# Patient Record
Sex: Male | Born: 1985 | Race: White | Hispanic: No | Marital: Single | Smoking: Former smoker
Health system: Southern US, Community
[De-identification: ages and names within clinical notes are randomized; demographics above are authoritative.]

## PROBLEM LIST (undated history)

## (undated) DIAGNOSIS — F419 Anxiety disorder, unspecified: Secondary | ICD-10-CM

## (undated) DIAGNOSIS — F32A Depression, unspecified: Secondary | ICD-10-CM

## (undated) HISTORY — DX: Depression, unspecified: F32.A

## (undated) HISTORY — DX: Anxiety disorder, unspecified: F41.9

---

## 2018-08-15 ENCOUNTER — Encounter: Payer: Self-pay | Admitting: Emergency Medicine

## 2018-08-15 ENCOUNTER — Emergency Department
Admission: EM | Admit: 2018-08-15 | Discharge: 2018-08-15 | Disposition: A | Discharge status: Home / Self Care | Payer: Self-pay | Source: Home / Self Care | Attending: Family Medicine | Admitting: Family Medicine

## 2018-08-15 ENCOUNTER — Emergency Department (INDEPENDENT_AMBULATORY_CARE_PROVIDER_SITE_OTHER): Payer: Self-pay

## 2018-08-15 ENCOUNTER — Other Ambulatory Visit: Payer: Self-pay

## 2018-08-15 DIAGNOSIS — M940 Chondrocostal junction syndrome [Tietze]: Secondary | ICD-10-CM

## 2018-08-15 DIAGNOSIS — R079 Chest pain, unspecified: Secondary | ICD-10-CM

## 2018-08-15 DIAGNOSIS — R0602 Shortness of breath: Secondary | ICD-10-CM

## 2018-08-15 NOTE — ED Triage Notes (Signed)
Patient has noticed pressure in upper left chest combined with shortness of breath over past week; did have URI last week that he treated with OTCs and it resolved. Admits to vaping 100 hits since this morning. No signs of acute distress. Dr.Beese notified immediately after triage.

## 2018-08-15 NOTE — ED Provider Notes (Signed)
[facility]    CSN: [id number] Arrival date & time: 08/15/18  1529     History   Chief Complaint Chief Complaint  Patient presents with  . Chest Pain  . Shortness of Breath    HPI Jerry Davis is a 33 y.o. male.   Patient developed a sensation of constant tightness in his anterior chest about 5 days ago.  He feels like he cannot fully expand his chest, but denies actual dyspnea.  Last night he developed an intermittent lancinating pain in his right chest radiating to his back.  The pain is not worse with activity.  He reports that he had a cold-like illness about 1.5 weeks ago that resolved completely.  The history is provided by the patient.  Chest Pain  Pain location:  R chest Pain quality: stabbing and tightness   Pain radiates to:  Upper back Pain severity:  Mild Onset quality:  Sudden Duration:  5 hours Timing:  Constant Progression:  Unchanged Chronicity:  New Context: breathing, lifting, movement and at rest   Context: not eating, not raising an arm and not trauma   Relieved by:  None tried Worsened by:  Deep breathing and movement Ineffective treatments: Tums. Associated symptoms: shortness of breath   Associated symptoms: no abdominal pain, no AICD problem, no cough, no diaphoresis, no dizziness, no dysphagia, no fatigue, no fever, no heartburn, no lower extremity edema, no nausea, no palpitations, no vomiting and no weakness   Risk factors: smoking   Risk factors: no prior DVT/PE   Shortness of Breath  Associated symptoms: chest pain   Associated symptoms: no abdominal pain, no cough, no diaphoresis, no fever, no vomiting and no wheezing     History reviewed. No pertinent past medical history.  There are no active problems to display for this patient.   History reviewed. No pertinent surgical history.     Home Medications    Prior to Admission medications   Not on File    Family History History reviewed. No pertinent family  history.  Social History Social History   Tobacco Use  . Smoking status: Current Every Day Smoker  . Smokeless tobacco: Current User  Substance Use Topics  . Alcohol use: Yes  . Drug use: Not on file     Allergies   Patient has no known allergies.   Review of Systems Review of Systems  Constitutional: Negative for appetite change, chills, diaphoresis, fatigue and fever.  HENT: Negative for trouble swallowing.   Respiratory: Positive for chest tightness and shortness of breath. Negative for cough, wheezing and stridor.   Cardiovascular: Positive for chest pain. Negative for palpitations and leg swelling.  Gastrointestinal: Negative for abdominal pain, heartburn, nausea and vomiting.  Neurological: Negative for dizziness and weakness.  All other systems reviewed and are negative.    Physical Exam Triage Vital Signs ED Triage Vitals  Enc Vitals Group     BP 08/15/18 1605 (!) 148/86     Pulse Rate 08/15/18 1605 73     Resp 08/15/18 1605 18     Temp 08/15/18 1605 97.9 F (36.6 C)     Temp Source 08/15/18 1605 Oral     SpO2 08/15/18 1605 97 %     Weight 08/15/18 1607 (!) 350 lb (158.8 kg)     Height 08/15/18 1607  (1.803 m)     Head Circumference --      Peak Flow --      Pain Score 08/15/18 1606  1     Pain Loc --      Pain Edu? --      Excl. in GC? --    No data found.  Updated Vital Signs BP (!) 148/86 (BP Location: Left Arm)   Pulse 73   Temp 97.9 F (36.6 C) (Oral)   Resp 18   Ht 5\' 11"  (1.803 m)   Wt (!) 158.8 kg   SpO2 97%   BMI 48.82 kg/m   Visual Acuity Right Eye Distance:   Left Eye Distance:   Bilateral Distance:    Right Eye Near:   Left Eye Near:    Bilateral Near:     Physical Exam Vitals signs and nursing note reviewed.  Constitutional:      General: He is not in acute distress.    Appearance: He is well-developed. He is obese.  HENT:     Head: Normocephalic.     Right Ear: Tympanic membrane and ear canal normal.     Left  Ear: Tympanic membrane and ear canal normal.     Nose: Nose normal.     Mouth/Throat:     Pharynx: Oropharynx is clear.  Eyes:     Conjunctiva/sclera: Conjunctivae normal.     Pupils: Pupils are equal, round, and reactive to light.  Cardiovascular:     Rate and Rhythm: Regular rhythm.     Heart sounds: Normal heart sounds.  Pulmonary:     Breath sounds: Normal breath sounds.  Chest:     Chest wall: Tenderness present. No crepitus.       Comments: Chest:  Distinct tenderness to palpation over the mid-sternum.  Abdominal:     General: Abdomen is flat. Bowel sounds are normal.     Palpations: Abdomen is soft.     Tenderness: There is no abdominal tenderness.  Musculoskeletal:        General: No tenderness.     Right lower leg: No edema.     Left lower leg: No edema.  Lymphadenopathy:     Cervical: No cervical adenopathy.  Skin:    General: Skin is warm and dry.     Findings: No rash.  Neurological:     Mental Status: He is alert.      UC Treatments / Results  Labs (all labs ordered are listed, but only abnormal results are displayed) Labs Reviewed - No data to display  EKG None  Radiology Dg Chest 2 View  Result Date: 08/15/2018 CLINICAL DATA:  Chest pain with shortness of breath for 6 days. Smoker. EXAM: CHEST - 2 VIEW COMPARISON:  None. FINDINGS: The heart size and mediastinal contours are normal. The lungs are clear. There is no pleural effusion or pneumothorax. No acute osseous findings are identified. IMPRESSION: No active cardiopulmonary process. Electronically Signed   By: Carey Bullocks M.D.   On: 08/15/2018 16:41    Procedures Procedures (including critical care time)  Medications Ordered in UC Medications - No data to display  Initial Impression / Assessment and Plan / UC Course  I have reviewed the triage vital signs and the nursing notes.  Pertinent labs & imaging results that were available during my care of the patient were reviewed by me and  considered in my medical decision making (see chart for details).    Reassurance. Followup with Dr. Rodney Langton or Dr. Clementeen [facility] ([facility]) if not improving about two weeks.   Final Clinical Impressions(s) / UC Diagnoses   Final diagnoses:  Costochondritis  Discharge Instructions     Try taking Ibuprofen 200mg , 4 tabs every 8 hours with food.  Apply ice pack for 20 to 30 minutes, 3 to 4 times daily  Continue until pain decreases    ED Prescriptions    None         Lattie Haw, MD 08/15/18 [phone]

## 2018-08-15 NOTE — Discharge Instructions (Addendum)
Try taking Ibuprofen 200mg , 4 tabs every 8 hours with food.  Apply ice pack for 20 to 30 minutes, 3 to 4 times daily  Continue until pain decreases

## 2020-01-28 IMAGING — DX DG CHEST 2V
2 series · 2 of 2 positions shown · non-contrast
Comparison: None.

CLINICAL DATA: Chest pain with shortness of breath for 6 days.
Smoker.

EXAM:
CHEST - 2 VIEW

[chest pa]
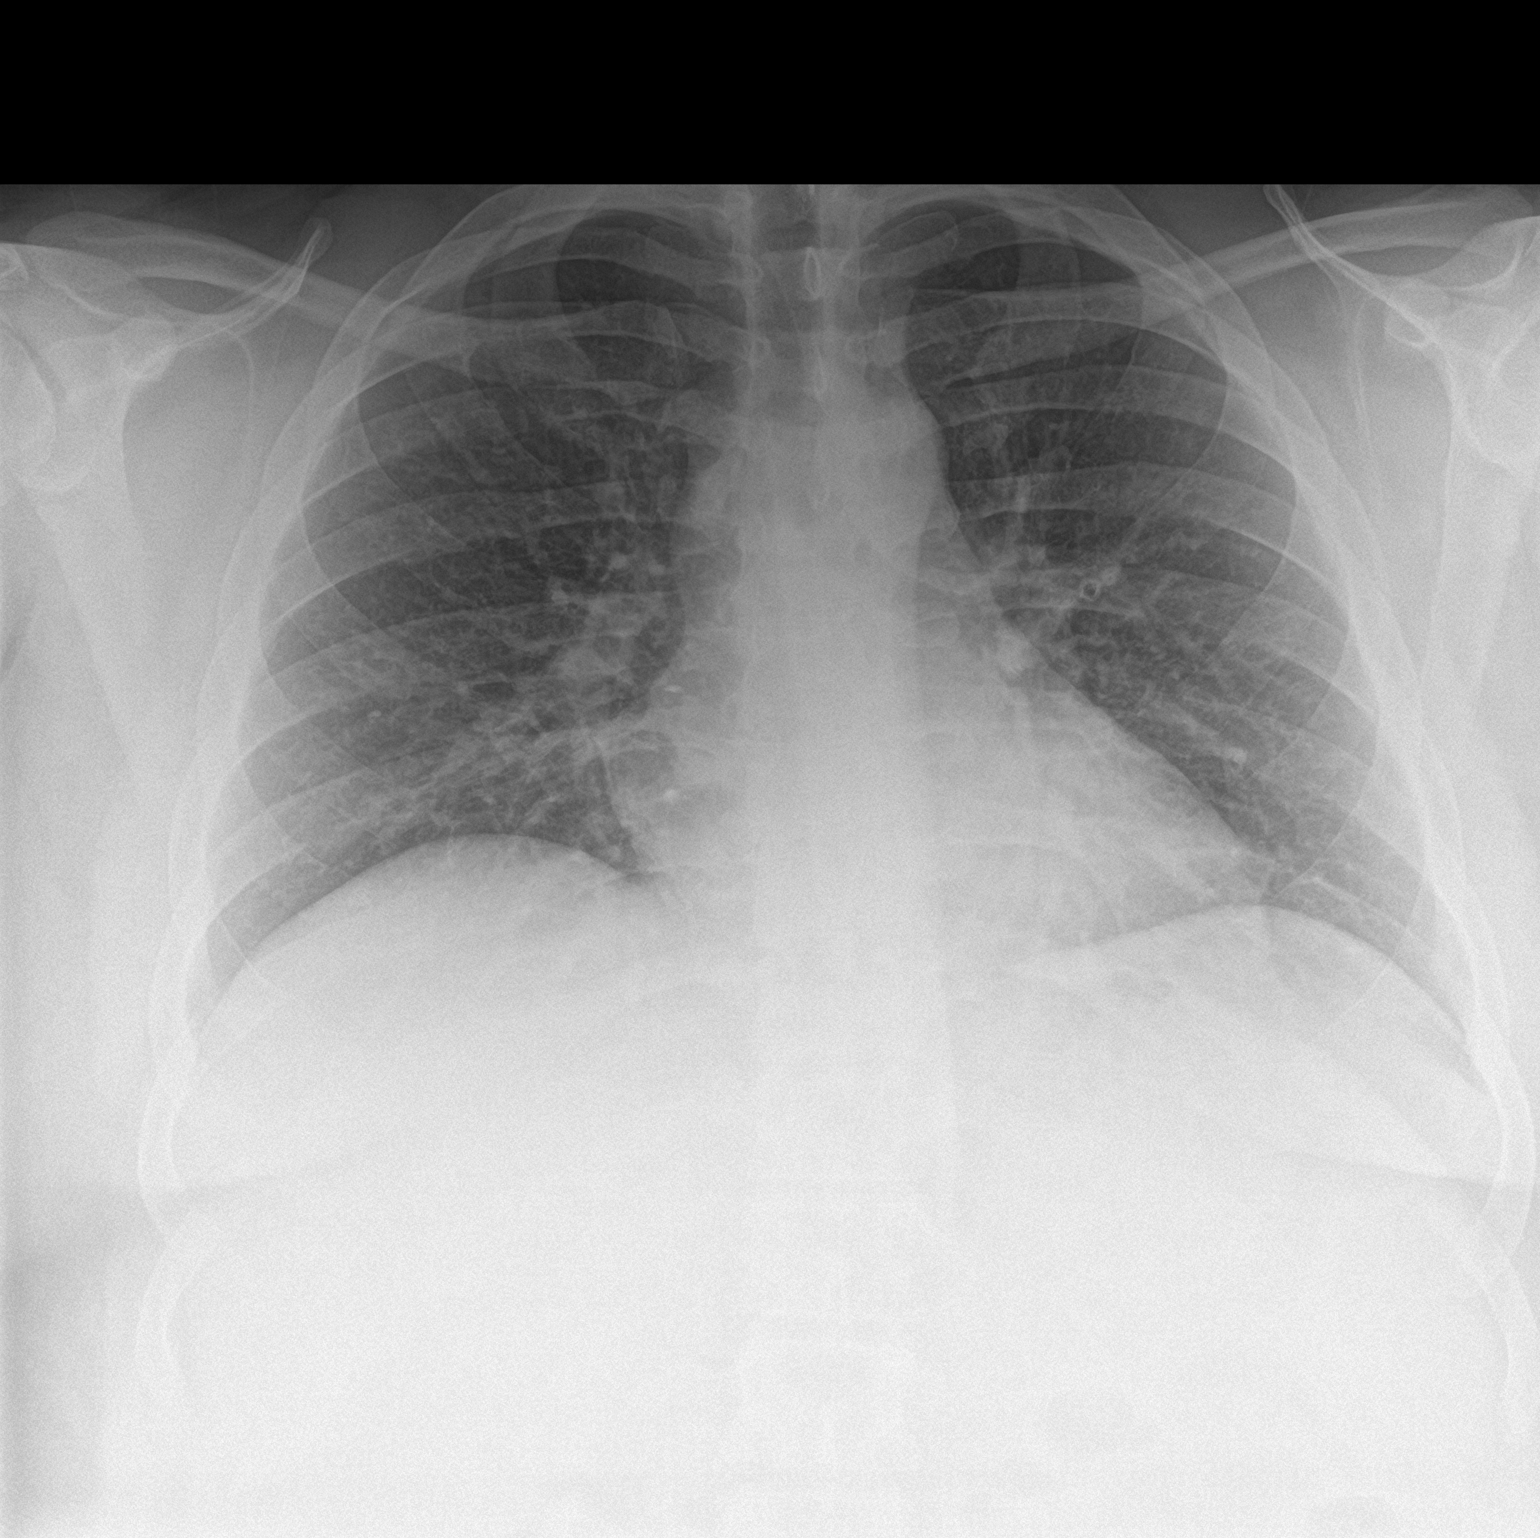

[chest lat]
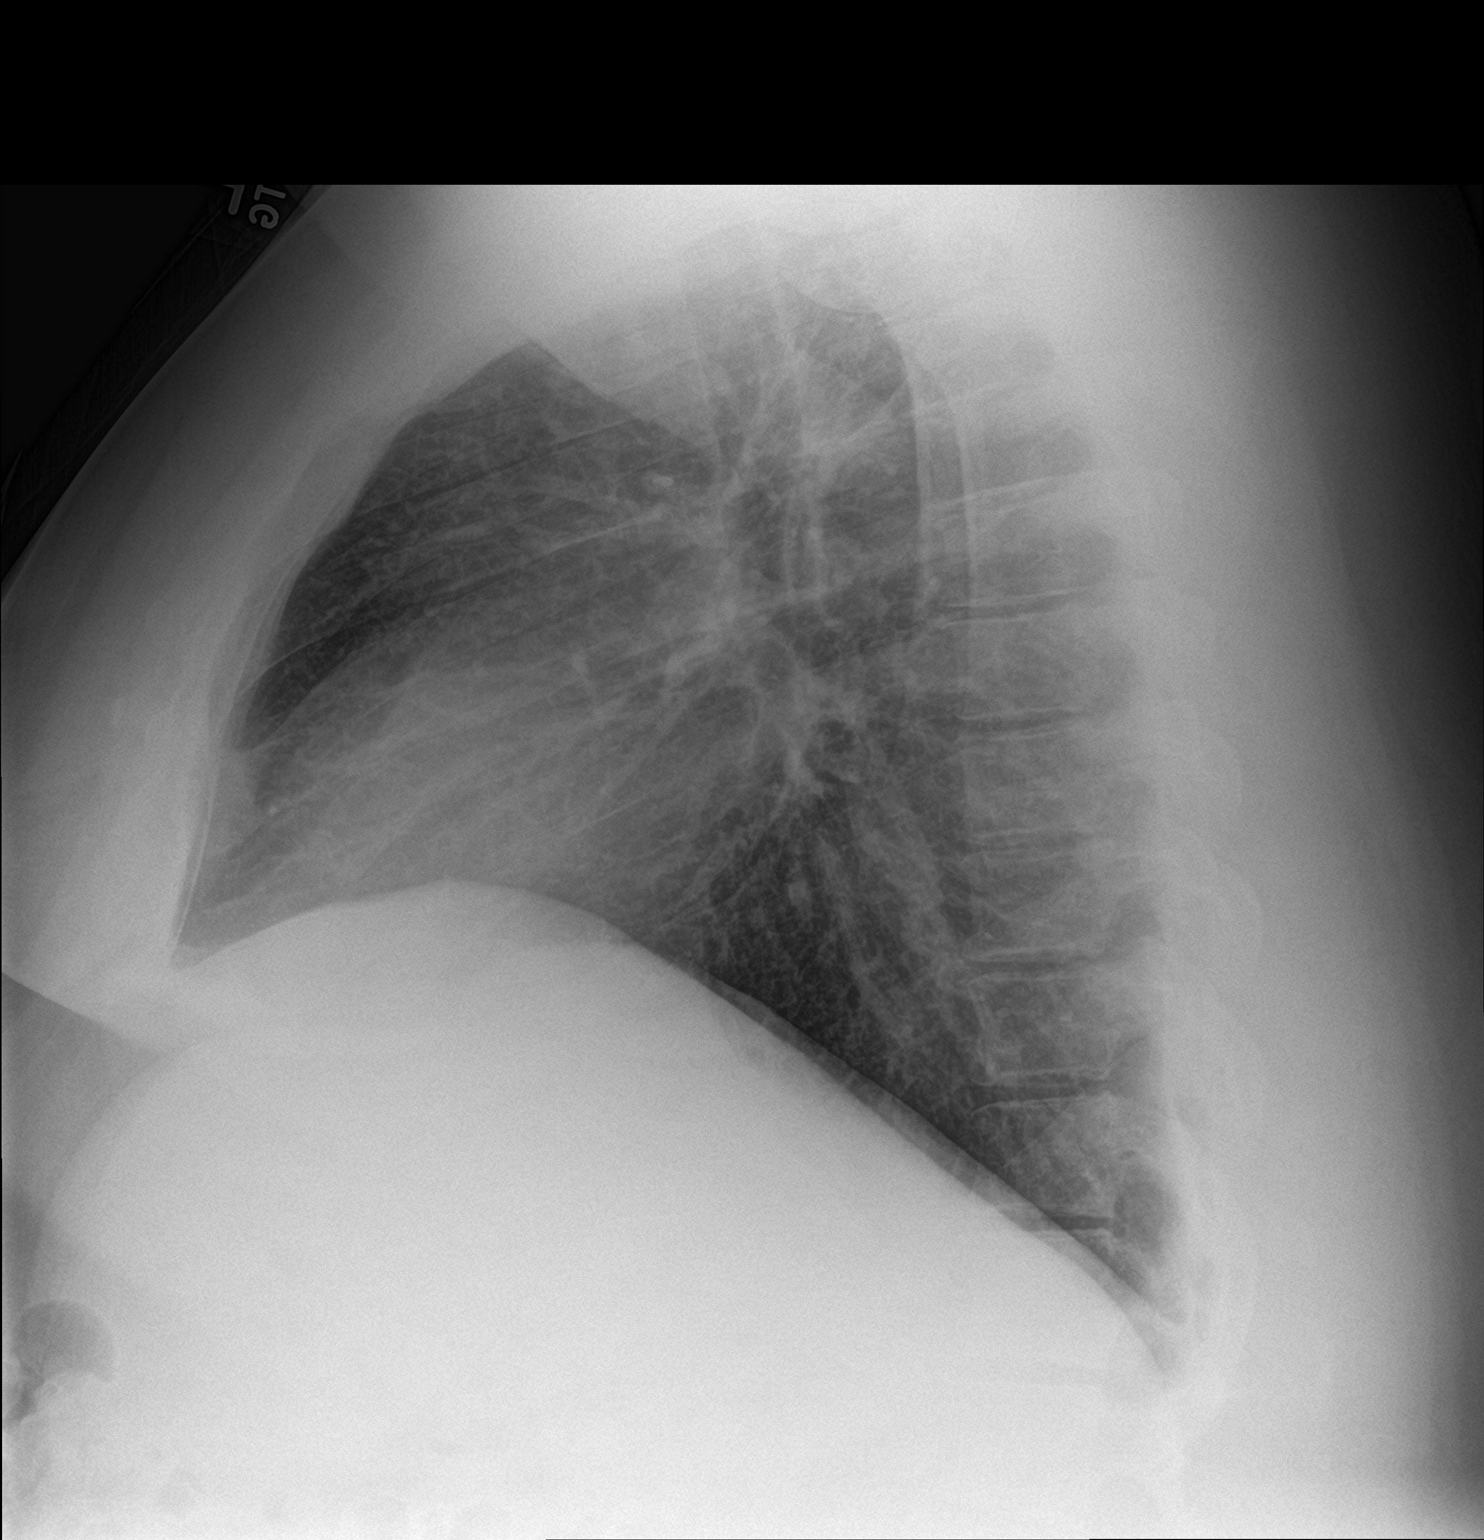

[2 of 2 positions shown; findings below may reference images not displayed]

FINDINGS: The heart size and mediastinal contours are normal. The lungs are
clear. There is no pleural effusion or pneumothorax. No acute
osseous findings are identified.
IMPRESSION: No active cardiopulmonary process.

## 2020-03-19 ENCOUNTER — Emergency Department: Admit: 2020-03-19 | Discharge status: Absent without leave | Payer: Self-pay

## 2020-03-19 ENCOUNTER — Other Ambulatory Visit: Payer: Self-pay

## 2020-03-19 ENCOUNTER — Encounter: Payer: Self-pay | Admitting: Family Medicine

## 2020-03-19 ENCOUNTER — Emergency Department
Admission: EM | Admit: 2020-03-19 | Discharge: 2020-03-19 | Disposition: A | Discharge status: Home / Self Care | Payer: Self-pay | Source: Home / Self Care

## 2020-03-19 DIAGNOSIS — J029 Acute pharyngitis, unspecified: Secondary | ICD-10-CM | POA: Diagnosis not present

## 2020-03-19 DIAGNOSIS — Z9189 Other specified personal risk factors, not elsewhere classified: Secondary | ICD-10-CM | POA: Diagnosis not present

## 2020-03-19 LAB — POCT RAPID STREP A (OFFICE): Rapid Strep A Screen: NEGATIVE

## 2020-03-19 MED ORDER — MAGIC MOUTHWASH W/LIDOCAINE: 10.0000 mL | ORAL | 0 refills | Status: DC | PRN

## 2020-03-19 NOTE — ED Triage Notes (Signed)
Pt presents to Urgent Care w/ c/o sore throat since yesterday morning. States it is worse today. Afebrile. Pt states he was tested for covid 4 days ago for work--test was negative.

## 2020-03-19 NOTE — Discharge Instructions (Addendum)
We are running the PCR Covid test which is more accurate than the rapid PCR test.  The results should be back within 4 days.   Strategies to prevent and/or treat COVID-19:  Vitamin D3 5000 IU (125 mcg) daily Vitamin C 500 mg twice daily Zinc 50 to 75 mg daily  Pepcid 20 mg twice a day  Listerine type mouthwash 4 times a day

## 2020-03-19 NOTE — ED Provider Notes (Signed)
[facility]    CSN: [id number] Arrival date & time: 03/19/20  0900      History   Chief Complaint Chief Complaint  Patient presents with  . Sore Throat    HPI BLAIKE Jerry Davis is a 34 y.o. male.   This is an established [place] urgent care patient, 34 year old male, presenting with sore throat.  Pt presents to Urgent Care w/ c/o sore throat since yesterday morning. States it is worse today. Afebrile. Pt states he was tested for covid 4 days ago for work--test was negative.   Patient works for [employer].  Company had a positive Covid employee recently and so the entire staff was tested, although the patient had no direct contact with that employee.  Patient denies cough, fever, change in appetite, change in sense of smell, or GI symptoms.     History reviewed. No pertinent past medical history.  There are no problems to display for this patient.   History reviewed. No pertinent surgical history.     Home Medications    Prior to Admission medications   Medication Sig Start Date End Date Taking? Authorizing Provider  magic mouthwash w/lidocaine SOLN Take 10 mLs by mouth every 2 (two) hours as needed for mouth pain. 03/19/20   Elvina Sidle, MD    Family History History reviewed. No pertinent family history.  Social History Social History   Tobacco Use  . Smoking status: Current Every Day Smoker  . Smokeless tobacco: Current User  Vaping Use  . Vaping Use: Every day  Substance Use Topics  . Alcohol use: Yes  . Drug use: Not on file     Allergies   Patient has no known allergies.   Review of Systems Review of Systems  HENT: Positive for sore throat.   All other systems reviewed and are negative.    Physical Exam Triage Vital Signs ED Triage Vitals  Enc Vitals Group     BP 03/19/20 0922 136/90     Pulse Rate 03/19/20 0922 74     Resp 03/19/20 0922 16     Temp 03/19/20 0922 97.9 F (36.6 C)     Temp Source  03/19/20 0922 Oral     SpO2 03/19/20 0922 96 %     Weight 03/19/20 0917 (!) 370 lb (167.8 kg)     Height --      Head Circumference --      Peak Flow --      Pain Score 03/19/20 0917 5     Pain Loc --      Pain Edu? --      Excl. in GC? --    No data found.  Updated Vital Signs BP 136/90 (BP Location: Right Arm)   Pulse 74   Temp 97.9 F (36.6 C) (Oral)   Resp 16   Wt (!) 167.8 kg   SpO2 96%   BMI 51.60 kg/m    Physical Exam Vitals and nursing note reviewed.  Constitutional:      General: He is not in acute distress.    Appearance: He is well-developed. He is obese.  HENT:     Head: Normocephalic.     Right Ear: Tympanic membrane normal.     Left Ear: Tympanic membrane normal.     Mouth/Throat:     Mouth: Mucous membranes are moist.     Pharynx: Posterior oropharyngeal erythema present. No pharyngeal swelling.     Tonsils: Tonsillar exudate present.  Eyes:  Conjunctiva/sclera: Conjunctivae normal.  Cardiovascular:     Rate and Rhythm: Normal rate and regular rhythm.     Heart sounds: Normal heart sounds.  Pulmonary:     Effort: Pulmonary effort is normal.     Breath sounds: Normal breath sounds.  Musculoskeletal:     Cervical back: Normal range of motion and neck supple.  Lymphadenopathy:     Cervical: No cervical adenopathy.  Skin:    General: Skin is warm and dry.  Neurological:     Mental Status: He is alert.      UC Treatments / Results  Labs (all labs ordered are listed, but only abnormal results are displayed) Labs Reviewed  SARS-COV-2 RNA,(COVID-19) QUALITATIVE NAAT  POCT RAPID STREP A (OFFICE)    EKG   Radiology No results found.  Procedures Procedures (including critical care time)  Medications Ordered in UC Medications - No data to display  Initial Impression / Assessment and Plan / UC Course  I have reviewed the triage vital signs and the nursing notes.  Pertinent labs & imaging results that were available during my care  of the patient were reviewed by me and considered in my medical decision making (see chart for details).    Final Clinical Impressions(s) / UC Diagnoses   Final diagnoses:  Acute pharyngitis, unspecified etiology  At increased risk of exposure to COVID-19 virus     Discharge Instructions     We are running the PCR Covid test which is more accurate than the rapid PCR test.  The results should be back within 4 days.   Strategies to prevent and/or treat COVID-19:  Vitamin D3 5000 IU (125 mcg) daily Vitamin C 500 mg twice daily Zinc 50 to 75 mg daily  Pepcid 20 mg twice a day  Listerine type mouthwash 4 times a day       ED Prescriptions    Medication Sig Dispense Auth. Provider   magic mouthwash w/lidocaine SOLN Take 10 mLs by mouth every 2 (two) hours as needed for mouth pain. 360 mL Elvina Sidle, MD     I have reviewed the PDMP during this encounter.   Elvina Sidle, MD 03/19/20 [phone]

## 2020-03-20 LAB — SARS-COV-2 RNA,(COVID-19) QUALITATIVE NAAT: SARS CoV2 RNA: NOT DETECTED

## 2020-06-18 ENCOUNTER — Ambulatory Visit: Payer: Self-pay

## 2020-06-22 ENCOUNTER — Other Ambulatory Visit: Payer: Self-pay

## 2020-06-22 ENCOUNTER — Encounter: Payer: Self-pay | Admitting: Family Medicine

## 2020-06-22 ENCOUNTER — Encounter: Payer: Self-pay | Admitting: Neurology

## 2020-06-22 ENCOUNTER — Ambulatory Visit (INDEPENDENT_AMBULATORY_CARE_PROVIDER_SITE_OTHER): Payer: BC Managed Care – PPO | Admitting: Family Medicine

## 2020-06-22 ENCOUNTER — Telehealth: Payer: Self-pay | Admitting: Neurology

## 2020-06-22 VITALS — BP 159/87 | HR 88 | Temp 97.7°F | Wt 393.0 lb

## 2020-06-22 DIAGNOSIS — F411 Generalized anxiety disorder: Secondary | ICD-10-CM

## 2020-06-22 DIAGNOSIS — K219 Gastro-esophageal reflux disease without esophagitis: Secondary | ICD-10-CM

## 2020-06-22 DIAGNOSIS — R5383 Other fatigue: Secondary | ICD-10-CM

## 2020-06-22 DIAGNOSIS — R030 Elevated blood-pressure reading, without diagnosis of hypertension: Secondary | ICD-10-CM

## 2020-06-22 DIAGNOSIS — R631 Polydipsia: Secondary | ICD-10-CM

## 2020-06-22 MED ORDER — PANTOPRAZOLE SODIUM 40 MG PO TBEC: 40.0000 mg | DELAYED_RELEASE_TABLET | Freq: Every day | ORAL | 3 refills | Status: DC

## 2020-06-22 MED ORDER — HYDROXYZINE PAMOATE 25 MG PO CAPS: 25.0000 mg | ORAL_CAPSULE | Freq: Three times a day (TID) | ORAL | 1 refills | Status: DC | PRN

## 2020-06-22 MED ORDER — ESCITALOPRAM OXALATE 10 MG PO TABS: ORAL_TABLET | ORAL | 3 refills | Status: DC

## 2020-06-22 NOTE — Patient Instructions (Addendum)
Great to meet you today! Have labs completed today. Start escitalopram (lexapro).  Use hydroxyzine as needed for anxiety.  Start protonix for swallowing issues/heartburn.  See me again in 4-6 weeks.

## 2020-06-22 NOTE — Telephone Encounter (Signed)
Prior Authorization for Pantoprazole submitted via covermymeds. Awaiting response.  Your information has been submitted to [employer]. [employer] will review the request and notify you of the determination decision directly, typically within 72 hours of receiving all information.  You will also receive your request decision electronically. To check for an update later, open this request again from your dashboard.  If [employer] [place] has not responded within the specified timeframe or if you have any questions about your PA submission, contact [employer] [place] directly at [phone].  

## 2020-06-23 LAB — COMPLETE METABOLIC PANEL WITH GFR
AG Ratio: 1.7 (calc) (ref 1.0–2.5)
ALT: 36 U/L (ref 9–46)
AST: 28 U/L (ref 10–40)
Albumin: 4.5 g/dL (ref 3.6–5.1)
Alkaline phosphatase (APISO): 68 U/L (ref 36–130)
BUN: 12 mg/dL (ref 7–25)
CO2: 23 mmol/L (ref 20–32)
Calcium: 9.8 mg/dL (ref 8.6–10.3)
Chloride: 105 mmol/L (ref 98–110)
Creat: 1.05 mg/dL (ref 0.60–1.35)
GFR, Est African American: 108 mL/min/{1.73_m2} (ref >=60)
GFR, Est Non African American: 93 mL/min/{1.73_m2} (ref >=60)
Globulin: 2.7 g/dL (calc) (ref 1.9–3.7)
Glucose, Bld: 105 mg/dL — ABNORMAL HIGH (ref 65–99)
Potassium: 4 mmol/L (ref 3.5–5.3)
Sodium: 140 mmol/L (ref 135–146)
Total Bilirubin: 0.5 mg/dL (ref 0.2–1.2)
Total Protein: 7.2 g/dL (ref 6.1–8.1)

## 2020-06-23 LAB — CBC
HCT: 44.6 % (ref 38.5–50.0)
Hemoglobin: 15.6 g/dL (ref 13.2–17.1)
MCH: 30 pg (ref 27.0–33.0)
MCHC: 35 g/dL (ref 32.0–36.0)
MCV: 85.8 fL (ref 80.0–100.0)
MPV: 12.4 fL (ref 7.5–12.5)
Platelets: 225 Thousand/uL (ref 140–400)
RBC: 5.2 Million/uL (ref 4.20–5.80)
RDW: 12.3 % (ref 11.0–15.0)
WBC: 6.5 Thousand/uL (ref 3.8–10.8)

## 2020-06-23 LAB — TSH: TSH: 1.85 m[IU]/L (ref 0.40–4.50)

## 2020-06-25 DIAGNOSIS — F411 Generalized anxiety disorder: Secondary | ICD-10-CM | POA: Insufficient documentation

## 2020-06-25 DIAGNOSIS — R631 Polydipsia: Secondary | ICD-10-CM | POA: Insufficient documentation

## 2020-06-25 DIAGNOSIS — R030 Elevated blood-pressure reading, without diagnosis of hypertension: Secondary | ICD-10-CM | POA: Insufficient documentation

## 2020-06-25 DIAGNOSIS — R5383 Other fatigue: Secondary | ICD-10-CM | POA: Insufficient documentation

## 2020-06-25 NOTE — Progress Notes (Signed)
Jerry Davis - 34 y.o. male MRN [id number]  Date of birth: April 07, 1986  Subjective Chief Complaint  Patient presents with  . Establish Care    HPI Jerry Davis is a 34 y.o. male here today for initial visit.  He has not had a PCP In several years.  He has a few concerns today.   He reports feeling of increased thirst and fatigue.  Concerned about possible diabetes.  Some distant relatives with diabetes but no first degree relatives.  Denies increased urination.    He reports some difficulty with swallowing.  He feels like food gets stuck in his upper throat.  Does not notice with liquids.  He has had frequent heartburn.  No nausea or chronic cough.    He has also had some anxiety.  He has some stress related to job.  He has had some feeling of panic at times.  He has never tried medication or seen a therapist for this.  He would like to try medication to help with anxiety.   He doesn't want to see a therapist at this time.   ROS:  A comprehensive ROS was completed and negative except as noted per HPI  No Known Allergies  History reviewed. No pertinent past medical history.  History reviewed. No pertinent surgical history.  Social History   Socioeconomic History  . Marital status: Single    Spouse name: Not on file  . Number of children: Not on file  . Years of education: Not on file  . Highest education level: Not on file  Occupational History  . Not on file  Tobacco Use  . Smoking status: Former Smoker    Types: Cigarettes    Quit date: 11/30/2019    Years since quitting: 0.5  . Smokeless tobacco: Never Used  Vaping Use  . Vaping Use: Every day  . Substances: Nicotine  Substance and Sexual Activity  . Alcohol use: Yes    Alcohol/week: 1.0 - 2.0 standard drink    Types: 1 - 2 Standard drinks or equivalent per week  . Drug use: Not Currently  . Sexual activity: Not Currently    Partners: Female  Other Topics Concern  . Not on file  Social History  Narrative  . Not on file   Social Determinants of Health   Financial Resource Strain: Not on file  Food Insecurity: Not on file  Transportation Needs: Not on file  Physical Activity: Not on file  Stress: Not on file  Social Connections: Not on file    Family History  Problem Relation Age of Onset  . Heart disease Maternal Grandfather     Health Maintenance  Topic Date Due  . Hepatitis C Screening  Never done  . HIV Screening  Never done  . COVID-19 Vaccine (1) 07/08/2020 (Originally 04/20/1993)  . INFLUENZA VACCINE  09/28/2020 (Originally 01/30/2020)  . TETANUS/TDAP  06/04/2028     ----------------------------------------------------------------------------------------------------------------------------------------------------------------------------------------------------------------- Physical Exam BP (!) 159/87 (BP Location: Left Arm, Patient Position: Sitting, Cuff Size: Large)   Pulse 88   Temp 97.7 F (36.5 C)   Wt (!) 393 lb (178.3 kg)   SpO2 97%   BMI 54.81 kg/m   Physical Exam Constitutional:      Appearance: Normal appearance.  HENT:     Head: Normocephalic and atraumatic.  Eyes:     General: No scleral icterus. Cardiovascular:     Rate and Rhythm: Normal rate and regular rhythm.  Pulmonary:     Effort: Pulmonary effort  is normal.     Breath sounds: Normal breath sounds.  Musculoskeletal:     Cervical back: Neck supple.  Neurological:     General: No focal deficit present.     Mental Status: He is alert.  Psychiatric:        Mood and Affect: Mood normal.        Behavior: Behavior normal.     ------------------------------------------------------------------------------------------------------------------------------------------------------------------------------------------------------------------- Assessment and Plan  GAD (generalized anxiety disorder) Start lexapro 10mg  daily (5mg  daily for the rist week).  Side effects of medication  discussed with him.  Hydoxyzine as needed for increased anxiety/panic.  Declines referral for therapist.   GERD (gastroesophageal reflux disease) Having reflux symptoms with dysphagia.  Will start protonix daily.  If not improving with this will place referral to GI.    Other fatigue Orders Placed This Encounter  Procedures  . COMPLETE METABOLIC PANEL WITH GFR  . CBC  . TSH     Blood pressure elevated without history of HTN Recheck at follow up.  If BP remains elevated will start medication.     Meds ordered this encounter  Medications  . escitalopram (LEXAPRO) 10 MG tablet    Sig: Take 5mg  daily x1 week then increase to 10mg  daily.    Dispense:  30 tablet    Refill:  3  . hydrOXYzine (VISTARIL) 25 MG capsule    Sig: Take 1 capsule (25 mg total) by mouth 3 (three) times daily as needed for anxiety.    Dispense:  45 capsule    Refill:  1  . pantoprazole (PROTONIX) 40 MG tablet    Sig: Take 1 tablet (40 mg total) by mouth daily.    Dispense:  30 tablet    Refill:  3    Return in about 4 weeks (around 07/20/2020) for anxiety/GERD.    This visit occurred during the SARS-CoV-2 public health emergency.  Safety protocols were in place, including screening questions prior to the visit, additional usage of staff PPE, and extensive cleaning of exam room while observing appropriate contact time as indicated for disinfecting solutions.

## 2020-06-25 NOTE — Assessment & Plan Note (Signed)
Start lexapro 10mg  daily (5mg  daily for the rist week).  Side effects of medication discussed with him.  Hydoxyzine as needed for increased anxiety/panic.  Declines referral for therapist.

## 2020-06-25 NOTE — Assessment & Plan Note (Signed)
Recheck at follow up.  If BP remains elevated will start medication.

## 2020-06-25 NOTE — Assessment & Plan Note (Signed)
Orders Placed This Encounter  Procedures  . COMPLETE METABOLIC PANEL WITH GFR  . CBC  . TSH

## 2020-06-25 NOTE — Assessment & Plan Note (Signed)
Having reflux symptoms with dysphagia.  Will start protonix daily.  If not improving with this will place referral to GI.

## 2021-01-05 ENCOUNTER — Encounter: Payer: Self-pay | Admitting: Emergency Medicine

## 2021-01-05 ENCOUNTER — Emergency Department
Admission: EM | Admit: 2021-01-05 | Discharge: 2021-01-05 | Disposition: A | Discharge status: Home / Self Care | Payer: Self-pay | Source: Home / Self Care | Attending: Family Medicine | Admitting: Family Medicine

## 2021-01-05 ENCOUNTER — Emergency Department (INDEPENDENT_AMBULATORY_CARE_PROVIDER_SITE_OTHER): Payer: Self-pay

## 2021-01-05 ENCOUNTER — Emergency Department: Admit: 2021-01-05 | Discharge status: Absent without leave | Payer: Self-pay

## 2021-01-05 ENCOUNTER — Other Ambulatory Visit: Payer: Self-pay

## 2021-01-05 DIAGNOSIS — M79671 Pain in right foot: Secondary | ICD-10-CM

## 2021-01-05 LAB — URIC ACID: Uric Acid, Serum: 8 mg/dL (ref 4.0–8.0)

## 2021-01-05 MED ORDER — PREDNISONE 20 MG PO TABS: ORAL_TABLET | ORAL | 0 refills | Status: DC

## 2021-01-05 NOTE — Discharge Instructions (Signed)
May take Tylenol daytime as needed for pain.  Increase fluid intake.

## 2021-01-05 NOTE — ED Triage Notes (Signed)
Patient c/o right foot pain since last night.  No apparent injury.  No swelling or redness.  Patient has taken Motrin for the pain.  No history of gout.

## 2021-01-05 NOTE — ED Provider Notes (Signed)
[facility]    CSN: [id number] Arrival date & time: 01/05/21  0850      History   Chief Complaint Chief Complaint  Patient presents with   Foot Pain    HPI Jerry Davis is a 35 y.o. male.   Last night after work while simply sitting, patient suddenly developed sharp pain in his right foot that has persisted.  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 He denies history of gout, but family history is positive for gout (father).  The history is provided by the patient.  Foot Pain This is a new problem. The current episode started yesterday. The problem occurs constantly. The problem has not changed since onset.Associated symptoms comments: none. The symptoms are aggravated by walking. Nothing relieves the symptoms. Treatments tried: Ibuprofen 800mg . The treatment provided mild relief.   History reviewed. No pertinent past medical history.  Patient Active Problem List   Diagnosis Date Noted   GAD (generalized anxiety disorder) 06/25/2020   Increased thirst 06/25/2020   Other fatigue 06/25/2020   Blood pressure elevated without history of HTN 06/25/2020   GERD (gastroesophageal reflux disease) 06/22/2020    History reviewed. No pertinent surgical history.     Home Medications    Prior to Admission medications   Medication Sig Start Date End Date Taking? Authorizing Provider  escitalopram (LEXAPRO) 10 MG tablet Take 5mg  daily x1 week then increase to 10mg  daily. 06/22/20  Yes , DO  hydrOXYzine (VISTARIL) 25 MG capsule Take 1 capsule (25 mg total) by mouth 3 (three) times daily as needed for anxiety. 06/22/20  Yes 06/24/20, DO  pantoprazole (PROTONIX) 40 MG tablet Take 1 tablet (40 mg total) by mouth daily. 06/22/20  Yes 06/24/20, DO  predniSONE (DELTASONE) 20 MG tablet Take one tab by mouth twice daily for 4 days, then one daily for 3 days. Take  with food. 01/05/21  Yes 06/24/20, MD  magic mouthwash w/lidocaine SOLN Take 10 mLs by mouth every 2 (two) hours as needed for mouth pain. 03/19/20   03/08/21, MD    Family History Family History  Problem Relation Age of Onset   Heart disease Maternal Grandfather     Social History Social History   Tobacco Use   Smoking status: Former    Pack years: 0.00    Types: Cigarettes    Quit date: 11/30/2019    Years since quitting: 1.1   Smokeless tobacco: Never  Vaping Use   Vaping Use: Every day   Substances: Nicotine  Substance Use Topics   Alcohol use: Yes    Alcohol/week: 1.0 - 2.0 standard drink    Types: 1 - 2 Standard drinks or equivalent per week   Drug use: Not Currently     Allergies   Patient has no known allergies.   Review of Systems Review of Systems  Constitutional:  Negative for activity change, appetite change, chills, diaphoresis, fatigue and fever.  Musculoskeletal:  Negative for joint swelling.       Right foot pain  Skin:  Negative for color change and rash.    Physical Exam Triage Vital Signs ED Triage Vitals  Enc Vitals Group     BP 01/05/21 0924 (!) 153/94     Pulse Rate 01/05/21 0924 74     Resp --      Temp 01/05/21 0924 97.8 F (36.6 C)  Temp Source 01/05/21 0924 Oral     SpO2 01/05/21 0924 97 %     Weight 01/05/21 0925 (!) 365 lb (165.6 kg)     Height 01/05/21 0925 5\' 11"  (1.803 m)     Head Circumference --      Peak Flow --      Pain Score 01/05/21 0925 7     Pain Loc --      Pain Edu? --      Excl. in GC? --    No data found.  Updated Vital Signs BP (!) 153/94 (BP Location: Left Arm)   Pulse 74   Temp 97.8 F (36.6 C) (Oral)   Ht 5\' 11"  (1.803 m)   Wt (!) 165.6 kg   SpO2 97%   BMI 50.91 kg/m   Visual Acuity Right Eye Distance:   Left Eye Distance:   Bilateral Distance:    Right Eye Near:   Left Eye Near:    Bilateral Near:     Physical Exam Vitals and nursing note reviewed.  Constitutional:       General: He is not in acute distress. HENT:     Head: Normocephalic.     Mouth/Throat:     Mouth: Mucous membranes are moist.  Eyes:     Pupils: Pupils are equal, round, and reactive to light.  Cardiovascular:     Rate and Rhythm: Normal rate.  Pulmonary:     Effort: Pulmonary effort is normal.  Musculoskeletal:        General: No swelling.     Cervical back: Neck supple.     Right lower leg: No edema.     Left lower leg: No edema.       Feet:     Comments: Right foot has distinct tenderness to palpation over the second MTP joint and second distal metatarsal.  No erythema or warmth.  Cap refill intact.  Lymphadenopathy:     Cervical: No cervical adenopathy.  Skin:    General: Skin is warm and dry.     Findings: No rash.  Neurological:     Mental Status: He is alert.     UC Treatments / Results  Labs (all labs ordered are listed, but only abnormal results are displayed) Labs Reviewed  URIC ACID    EKG   Radiology CLINICAL DATA:  Sudden onset foot pain yesterday. Tenderness over second MTP joint and distal second metatarsal.   EXAM: RIGHT FOOT COMPLETE - 3+ VIEW   COMPARISON:  None.   FINDINGS: No evidence of acute fracture. There is no significant degenerative change. There is mild soft tissue swelling of the foot. Small plantar calcaneal spur.   IMPRESSION: No acute osseous abnormality.  Soft tissue swelling of the foot.     Electronically Signed   By: 03/08/21   On: 01/05/2021 11:00  Procedures Procedures (including critical care time)  Medications Ordered in UC Medications - No data to display  Initial Impression / Assessment and Plan / UC Course  I have reviewed the triage vital signs and the nursing notes.  Pertinent labs & imaging results that were available during my care of the patient were reviewed by me and considered in my medical decision making (see chart for details).    Suspect acute gout.   Check uric acid.  Begin  prednisone burst/taper. Followup with Dr. Cap[facility] if not improving about one week.  Final Clinical Impressions(s) / UC Diagnoses   Final diagnoses:  Foot pain, right     Discharge Instructions      May take Tylenol daytime as needed for pain.  Increase fluid intake.     ED Prescriptions     Medication Sig Dispense Auth. Provider   predniSONE (DELTASONE) 20 MG tablet Take one tab by mouth twice daily for 4 days, then one daily for 3 days. Take with food. 11 tablet Lattie Haw, MD         Lattie Haw, MD 01/09/21 [phone]

## 2022-06-20 IMAGING — DX DG FOOT COMPLETE 3+V*R*
3 series · 3 of 3 positions shown · non-contrast
Comparison: None.

CLINICAL DATA: Sudden onset foot pain yesterday. Tenderness over
second MTP joint and distal second metatarsal.

EXAM:
RIGHT FOOT COMPLETE - 3+ VIEW

[foot ap]
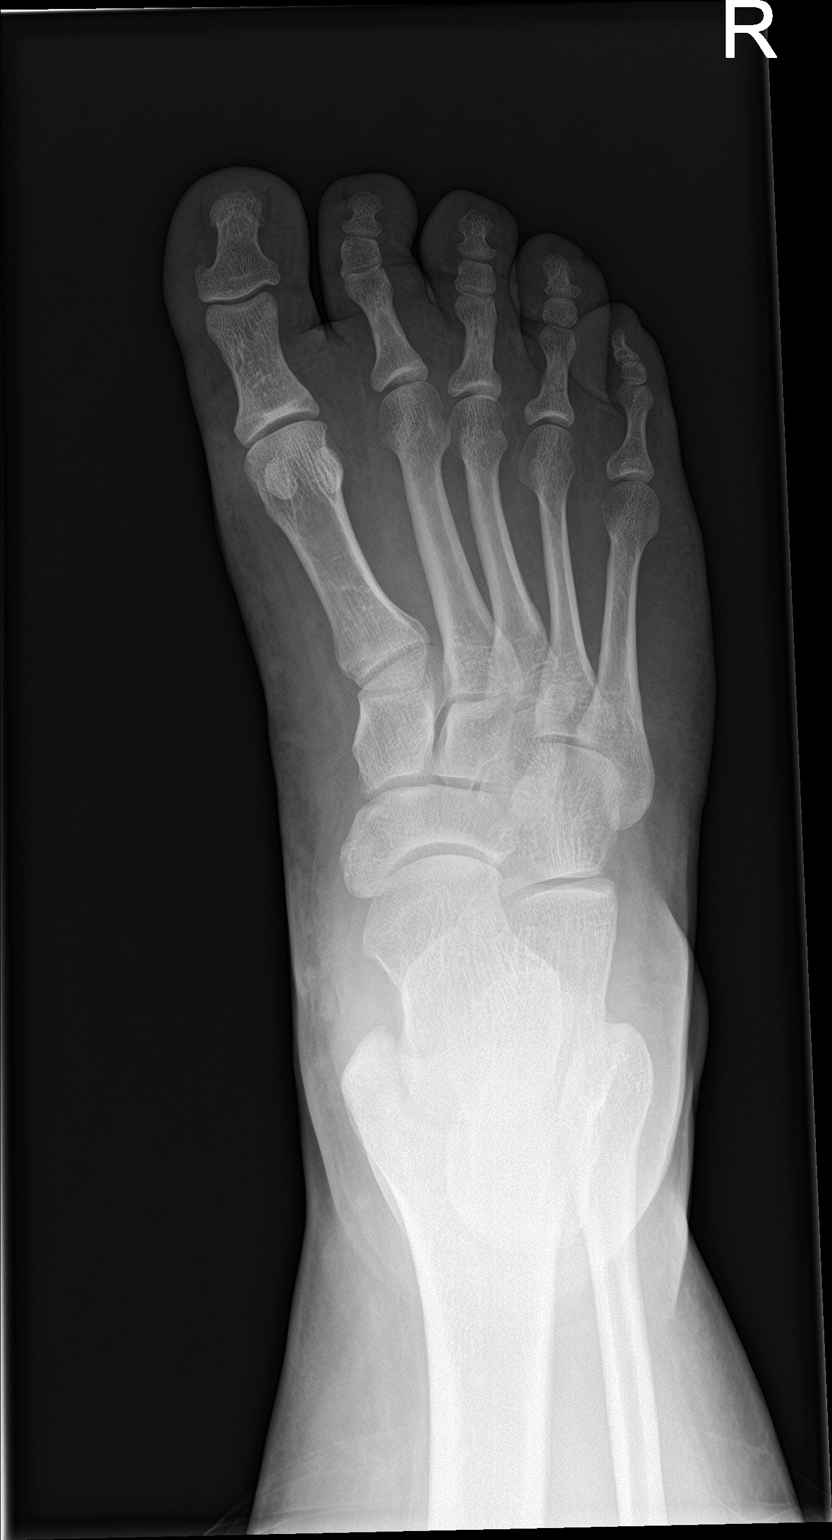

[foot obl]
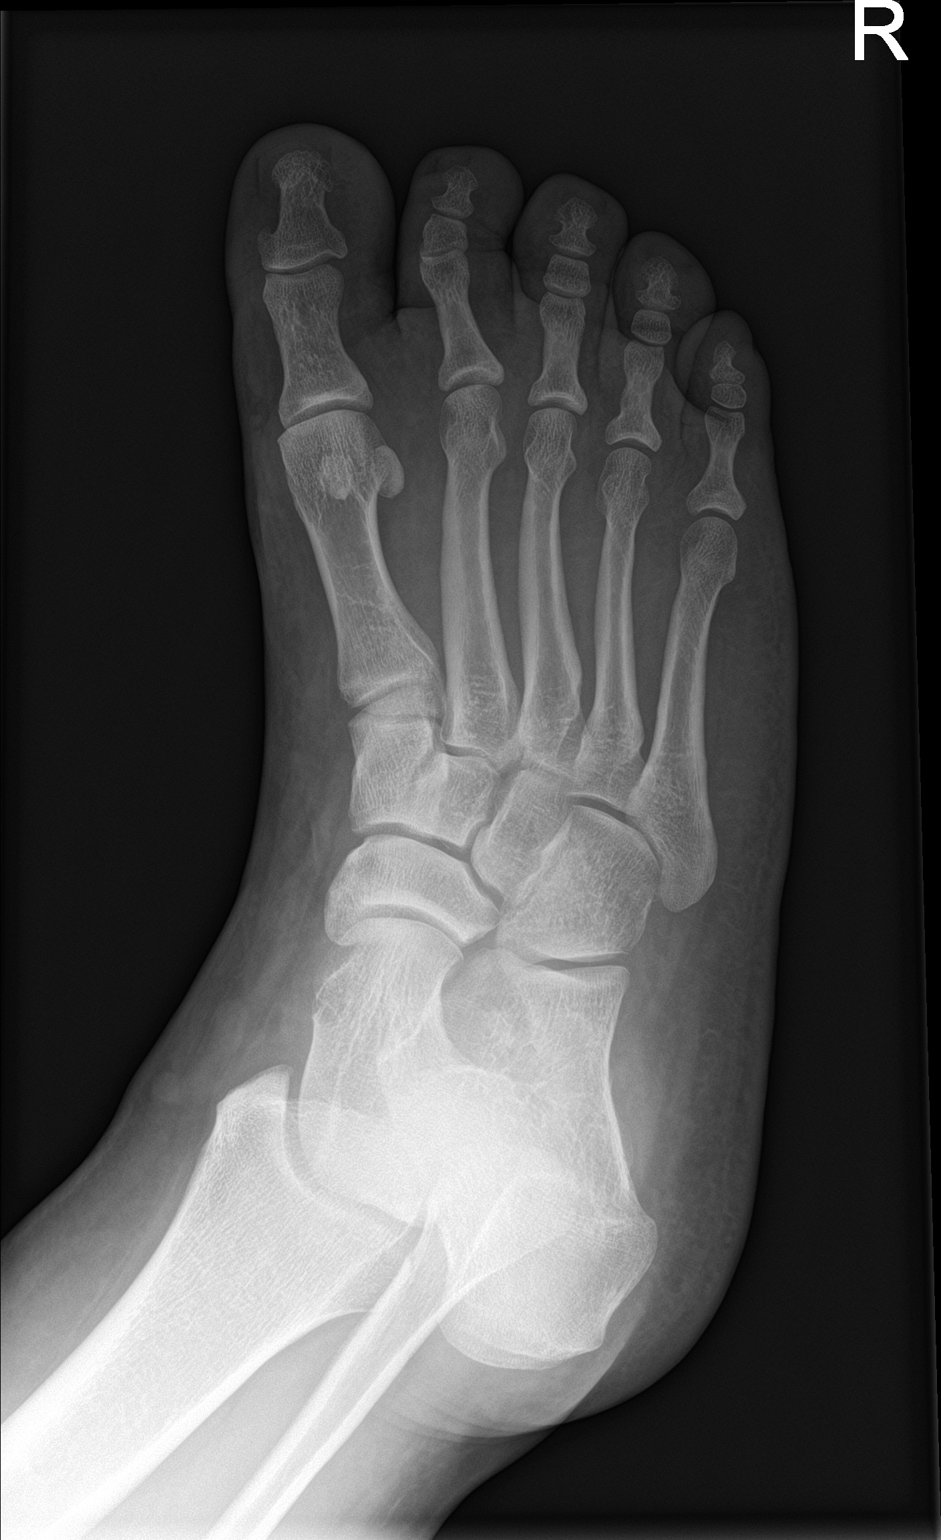

[foot lat]
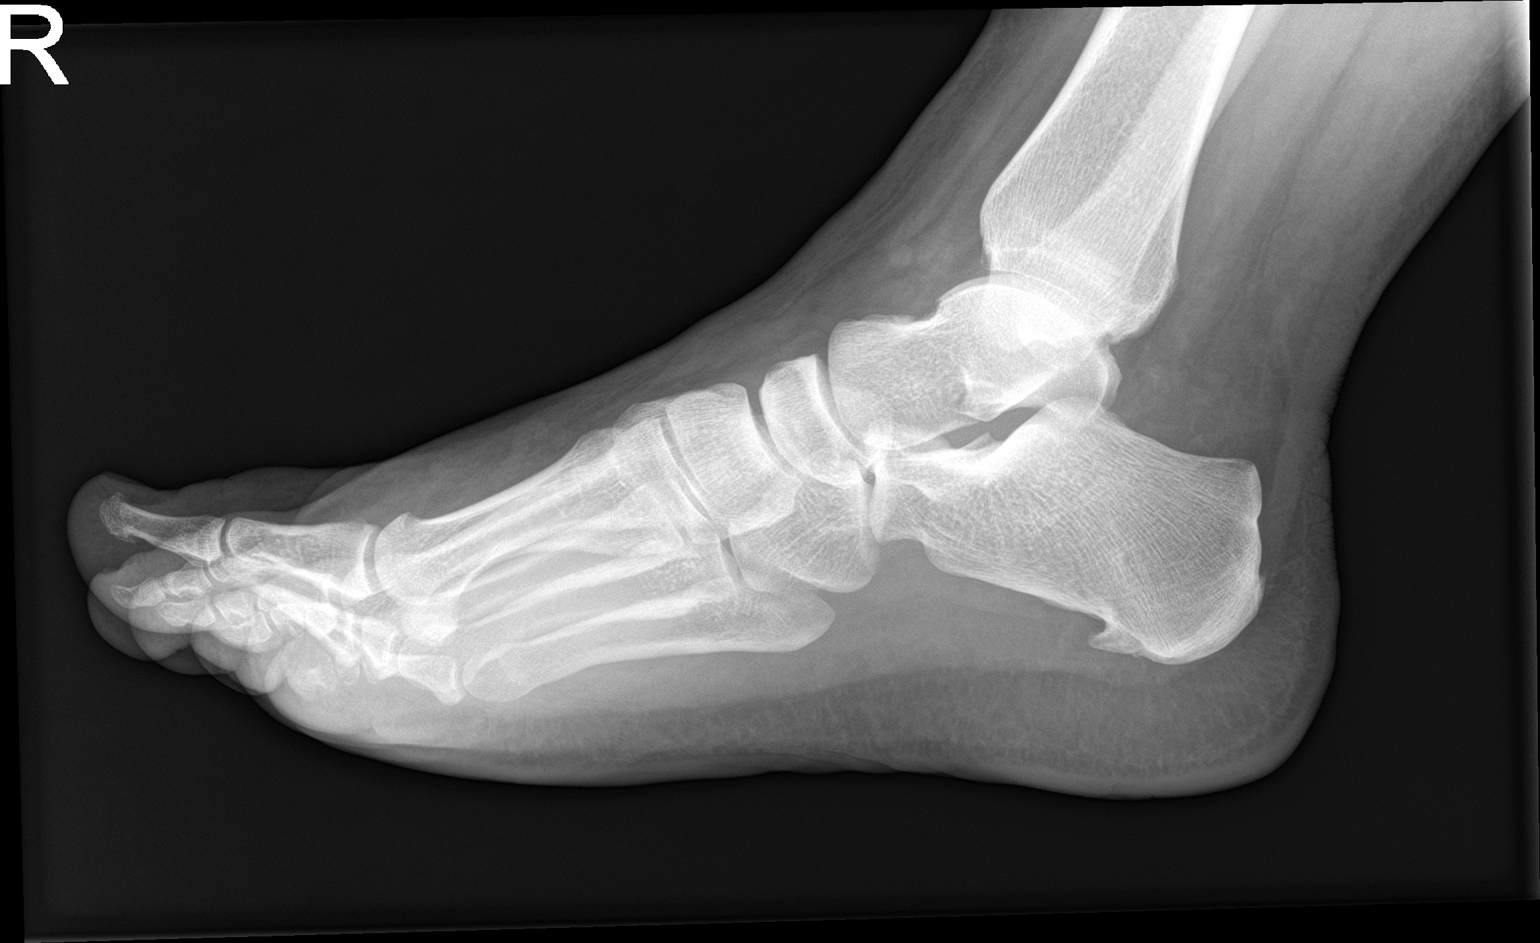

[3 of 3 positions shown; findings below may reference images not displayed]

FINDINGS: No evidence of acute fracture. There is no significant degenerative
change. There is mild soft tissue swelling of the foot. Small
plantar calcaneal spur.
IMPRESSION: No acute osseous abnormality.  Soft tissue swelling of the foot.

## 2022-11-26 ENCOUNTER — Ambulatory Visit
Admission: RE | Admit: 2022-11-26 | Discharge: 2022-11-26 | Disposition: A | Discharge status: Home / Self Care | Payer: Self-pay | Source: Ambulatory Visit | Attending: Family Medicine | Admitting: Family Medicine

## 2022-11-26 VITALS — BP 138/86 | HR 80 | Temp 98.4°F | Resp 18

## 2022-11-26 DIAGNOSIS — K649 Unspecified hemorrhoids: Secondary | ICD-10-CM

## 2022-11-26 MED ORDER — PHENYLEPHRINE IN HARD FAT 0.25 % RE SUPP: 1.0000 | Freq: Two times a day (BID) | RECTAL | 1 refills | Status: DC

## 2022-11-26 NOTE — ED Triage Notes (Signed)
Pt c/o rectal bleeding since Sunday. Says he woke up with blood in boxers. Prep H prn. Pain 2/10 Denies constipation. States job is very physical.

## 2022-11-26 NOTE — Discharge Instructions (Addendum)
Advised patient may use suppository for hemorrhoids daily or as needed.  Advised if symptoms worsen and/or unresolved please follow-up with general surgery, PCP or here for further evaluation.  Contact information provided with this AVS today.

## 2022-11-26 NOTE — ED Provider Notes (Signed)
[facility]    CSN: [id number] Arrival date & time: 11/26/22  1018      History   Chief Complaint Chief Complaint  Patient presents with   Hemorrhoids    HPI Jerry Davis is a 37 y.o. male.   HPI 37 year old male presents with possible hemorrhoids and rectal bleeding intermittently since Sunday.  Patient reports using Preparation H as needed.  Reports job is very physical.  PMH significant for morbid obesity, GERD and GAD.  Patient request work excuse note today.  History reviewed. No pertinent past medical history.  Patient Active Problem List   Diagnosis Date Noted   GAD (generalized anxiety disorder) 06/25/2020   Increased thirst 06/25/2020   Other fatigue 06/25/2020   Blood pressure elevated without history of HTN 06/25/2020   GERD (gastroesophageal reflux disease) 06/22/2020    History reviewed. No pertinent surgical history.     Home Medications    Prior to Admission medications   Medication Sig Start Date End Date Taking? Authorizing Provider  phenylephrine (,USE FOR PREPARATION-H,) 0.25 % suppository Place 1 suppository rectally 2 (two) times daily. 11/26/22  Yes Trevor Iha, FNP  magic mouthwash w/lidocaine SOLN Take 10 mLs by mouth every 2 (two) hours as needed for mouth pain. 03/19/20   Elvina Sidle, MD    Family History Family History  Problem Relation Age of Onset   Heart disease Maternal Grandfather     Social History Social History   Tobacco Use   Smoking status: Former    Types: Cigarettes    Quit date: 11/30/2019    Years since quitting: 2.9   Smokeless tobacco: Never  Vaping Use   Vaping Use: Every day   Substances: Nicotine  Substance Use Topics   Alcohol use: Yes    Alcohol/week: 1.0 - 2.0 standard drink of alcohol    Types: 1 - 2 Standard drinks or equivalent per week   Drug use: Not Currently     Allergies   Patient has no known allergies.   Review of Systems Review of Systems  Gastrointestinal:   Positive for anal bleeding and rectal pain.  All other systems reviewed and are negative.    Physical Exam Triage Vital Signs ED Triage Vitals  Enc Vitals Group     BP 11/26/22 1037 138/86     Pulse Rate 11/26/22 1037 80     Resp 11/26/22 1037 18     Temp 11/26/22 1037 98.4 F (36.9 C)     Temp Source 11/26/22 1037 Oral     SpO2 11/26/22 1037 98 %     Weight --      Height --      Head Circumference --      Peak Flow --      Pain Score 11/26/22 1040 2     Pain Loc --      Pain Edu? --      Excl. in GC? --    No data found.  Updated Vital Signs BP 138/86 (BP Location: Right Arm)   Pulse 80   Temp 98.4 F (36.9 C) (Oral)   Resp 18   SpO2 98%    Physical Exam Vitals and nursing note reviewed.  Constitutional:      Appearance: Normal appearance. He is obese.  HENT:     Head: Normocephalic and atraumatic.     Mouth/Throat:     Mouth: Mucous membranes are moist.     Pharynx: Oropharynx is clear.  Eyes:  Extraocular Movements: Extraocular movements intact.     Conjunctiva/sclera: Conjunctivae normal.     Pupils: Pupils are equal, round, and reactive to light.  Cardiovascular:     Rate and Rhythm: Normal rate and regular rhythm.     Pulses: Normal pulses.     Heart sounds: Normal heart sounds.  Pulmonary:     Effort: Pulmonary effort is normal.     Breath sounds: Normal breath sounds. No wheezing, rhonchi or rales.  Genitourinary:    Comments: Patient deferred examination of hemorrhoids today. Musculoskeletal:        General: Normal range of motion.     Cervical back: Normal range of motion and neck supple.  Skin:    General: Skin is warm and dry.  Neurological:     General: No focal deficit present.     Mental Status: He is alert and oriented to person, place, and time. Mental status is at baseline.  Psychiatric:        Mood and Affect: Mood normal.        Behavior: Behavior normal.        Thought Content: Thought content normal.      UC  Treatments / Results  Labs (all labs ordered are listed, but only abnormal results are displayed) Labs Reviewed - No data to display  EKG   Radiology No results found.  Procedures Procedures (including critical care time)  Medications Ordered in UC Medications - No data to display  Initial Impression / Assessment and Plan / UC Course  I have reviewed the triage vital signs and the nursing notes.  Pertinent labs & imaging results that were available during my care of the patient were reviewed by me and considered in my medical decision making (see chart for details).     MDM: 1.  Hemorrhoids, unspecified type-Rx'd phenylephrine suppository 0.2 5 Percent Place 1 suppository rectally 2 times daily, as needed.  2-day work excuse note provided to patient per request prior to discharge. Advised patient may use suppository for hemorrhoids daily or as needed.  Advised if symptoms worsen and/or unresolved please follow-up with general surgery, PCP or here for further evaluation.  Contact information provided with this AVS today. Final Clinical Impressions(s) / UC Diagnoses   Final diagnoses:  Hemorrhoids, unspecified hemorrhoid type     Discharge Instructions      Advised patient may use suppository for hemorrhoids daily or as needed.  Advised if symptoms worsen and/or unresolved please follow-up with general surgery, PCP or here for further evaluation.  Contact information provided with this AVS today.     ED Prescriptions     Medication Sig Dispense Auth. Provider   phenylephrine (,USE FOR PREPARATION-H,) 0.25 % suppository Place 1 suppository rectally 2 (two) times daily. 12 suppository Trevor Iha, FNP      PDMP not reviewed this encounter.   Trevor Iha, FNP 11/26/22 1128

## 2023-01-19 ENCOUNTER — Ambulatory Visit
Admission: EM | Admit: 2023-01-19 | Discharge: 2023-01-19 | Disposition: A | Discharge status: Home / Self Care | Payer: Self-pay | Attending: Family Medicine | Admitting: Family Medicine

## 2023-01-19 ENCOUNTER — Encounter: Payer: Self-pay | Admitting: Emergency Medicine

## 2023-01-19 DIAGNOSIS — R030 Elevated blood-pressure reading, without diagnosis of hypertension: Secondary | ICD-10-CM

## 2023-01-19 DIAGNOSIS — L237 Allergic contact dermatitis due to plants, except food: Secondary | ICD-10-CM

## 2023-01-19 NOTE — ED Provider Notes (Signed)
[facility]    CSN: [id number] Arrival date & time: 01/19/23  0903      History   Chief Complaint Chief Complaint  Patient presents with   Rash    HPI Jerry Davis is a 37 y.o. male.   HPI  Patient states he works outside.  He has a rash on his arm.  It is spreading.  It itches moderately.  He is concerned whether it could be insect bites versus poison ivy.  History reviewed. No pertinent past medical history.  Patient Active Problem List   Diagnosis Date Noted   GAD (generalized anxiety disorder) 06/25/2020   Increased thirst 06/25/2020   Other fatigue 06/25/2020   Blood pressure elevated without history of HTN 06/25/2020   GERD (gastroesophageal reflux disease) 06/22/2020    History reviewed. No pertinent surgical history.     Home Medications    Prior to Admission medications   Not on File    Family History Family History  Problem Relation Age of Onset   Heart disease Maternal Grandfather     Social History Social History   Tobacco Use   Smoking status: Former    Current packs/day: 0.00    Types: Cigarettes    Quit date: 11/30/2019    Years since quitting: 3.1   Smokeless tobacco: Never  Vaping Use   Vaping status: Every Day   Substances: Nicotine  Substance Use Topics   Alcohol use: Yes    Alcohol/week: 1.0 - 2.0 standard drink of alcohol    Types: 1 - 2 Standard drinks or equivalent per week   Drug use: Not Currently     Allergies   Patient has no known allergies.   Review of Systems Review of Systems  See HPI Physical Exam Triage Vital Signs ED Triage Vitals  Encounter Vitals Group     BP 01/19/23 0915 (!) 154/93     Systolic BP Percentile --      Diastolic BP Percentile --      Pulse Rate 01/19/23 0915 70     Resp 01/19/23 0915 17     Temp 01/19/23 0915 98.3 F (36.8 C)     Temp Source 01/19/23 0915 Oral     SpO2 01/19/23 0914 98 %     Weight --      Height --      Head Circumference --      Peak  Flow --      Pain Score 01/19/23 0914 0     Pain Loc --      Pain Education --      Exclude from Growth Chart --    No data found.  Updated Vital Signs BP (!) 154/93 (BP Location: Right Arm)   Pulse 70   Temp 98.3 F (36.8 C) (Oral)   Resp 17   SpO2 98%       Physical Exam Constitutional:      General: He is not in acute distress.    Appearance: He is well-developed. He is obese.  HENT:     Head: Normocephalic and atraumatic.  Eyes:     Conjunctiva/sclera: Conjunctivae normal.     Pupils: Pupils are equal, round, and reactive to light.  Cardiovascular:     Rate and Rhythm: Normal rate.  Pulmonary:     Effort: Pulmonary effort is normal. No respiratory distress.  Abdominal:     General: There is no distension.     Palpations: Abdomen is soft.  Musculoskeletal:  General: Normal range of motion.     Cervical back: Normal range of motion.  Skin:    General: Skin is warm and dry.     Findings: Rash present.     Comments: Clusters of vesicles some in a linear distribution on both forearms  Neurological:     Mental Status: He is alert.      UC Treatments / Results  Labs (all labs ordered are listed, but only abnormal results are displayed) Labs Reviewed - No data to display  EKG   Radiology No results found.  Procedures Procedures (including critical care time)  Medications Ordered in UC Medications - No data to display  Initial Impression / Assessment and Plan / UC Course  I have reviewed the triage vital signs and the nursing notes.  Pertinent labs & imaging results that were available during my care of the patient were reviewed by me and considered in my medical decision making (see chart for details).     Initial blood pressure elevated.  This is discussed.  Follow-up blood pressure is improved.  He will see his PCP as normally scheduled Final Clinical Impressions(s) / UC Diagnoses   Final diagnoses:  Allergic contact dermatitis due to  plants, except food     Discharge Instructions      Return as needed May use cortisone cream or calamine lotion on the rash   ED Prescriptions   None    PDMP not reviewed this encounter.   Eustace Moore, MD 01/19/23 [phone]

## 2023-01-19 NOTE — ED Triage Notes (Signed)
Pt reports a rash/insect bite on bilateral arms. States the rash first appeared on Thursday evening. Has tried topical and PO Benadryl with no relief.

## 2023-01-19 NOTE — Discharge Instructions (Signed)
Return as needed May use cortisone cream or calamine lotion on the rash

## 2023-04-05 ENCOUNTER — Encounter: Payer: Self-pay | Admitting: Family Medicine

## 2023-04-07 ENCOUNTER — Ambulatory Visit: Payer: Self-pay | Admitting: Family Medicine

## 2023-04-07 VITALS — BP 135/79 | HR 81 | Ht 71.0 in | Wt 344.0 lb

## 2023-04-07 DIAGNOSIS — K219 Gastro-esophageal reflux disease without esophagitis: Secondary | ICD-10-CM

## 2023-04-07 DIAGNOSIS — F411 Generalized anxiety disorder: Secondary | ICD-10-CM

## 2023-04-07 MED ORDER — ESCITALOPRAM OXALATE 20 MG PO TABS: 20.0000 mg | ORAL_TABLET | ORAL | 1 refills | Status: DC | PRN

## 2023-04-07 MED ORDER — PANTOPRAZOLE SODIUM 40 MG PO TBEC: 40.0000 mg | DELAYED_RELEASE_TABLET | Freq: Every day | ORAL | 1 refills | Status: DC

## 2023-04-07 MED ORDER — ESCITALOPRAM OXALATE 10 MG PO TABS: 10.0000 mg | ORAL_TABLET | ORAL | 1 refills | Status: DC | PRN

## 2023-04-07 NOTE — Assessment & Plan Note (Signed)
Adding lexapro on and recommend consistent use.  Increase to 20mg  as he is currently taking 10mg .  F/u in 4-6 weeks.

## 2023-04-07 NOTE — Progress Notes (Signed)
Jerry Davis - 37 y.o. male MRN [id number]  Date of birth: 1986-05-18  Subjective Chief Complaint  Patient presents with   Anxiety    HPI Jerry Davis is a 37 y.o. male here today for follow up visit.   It has been a couple of years since his last visit with me.  He was prescribed lexapro previously and has been using on an intermittent basis for anxiety.  He had been holding onto to a bottle that he had and felt that he was starting to drink more to manage his anxiety so he went ahead and restarted this. He does feel that this has been effective for management of his anxiety but would like to try increase in strength.  He has not really had any noticeable side effects from this.   ROS:  A comprehensive ROS was completed and negative except as noted per HPI  No Known Allergies  History reviewed. No pertinent past medical history.  History reviewed. No pertinent surgical history.  Social History   Socioeconomic History   Marital status: Single    Spouse name: Not on file   Number of children: Not on file   Years of education: Not on file   Highest education level: GED or equivalent  Occupational History   Not on file  Tobacco Use   Smoking status: Former    Current packs/day: 0.00    Types: Cigarettes    Quit date: 11/30/2019    Years since quitting: 3.3   Smokeless tobacco: Never  Vaping Use   Vaping status: Every Day   Substances: Nicotine  Substance and Sexual Activity   Alcohol use: Yes    Alcohol/week: 1.0 - 2.0 standard drink of alcohol    Types: 1 - 2 Standard drinks or equivalent per week   Drug use: Not Currently   Sexual activity: Not Currently    Partners: Female  Other Topics Concern   Not on file  Social History Narrative   Not on file   Social Determinants of Health   Financial Resource Strain: Medium Risk (04/07/2023)   Overall Financial Resource Strain (CARDIA)    Difficulty of Paying Living Expenses: Somewhat hard  Food Insecurity:  Food Insecurity Present (04/07/2023)   Hunger Vital Sign    Worried About Running Out of Food in the Last Year: Sometimes true    Ran Out of Food in the Last Year: Never true  Transportation Needs: No Transportation Needs (04/07/2023)   PRAPARE - Administrator, Civil Service (Medical): No    Lack of Transportation (Non-Medical): No  Physical Activity: Sufficiently Active (04/07/2023)   Exercise Vital Sign    Days of Exercise per Week: 5 days    Minutes of Exercise per Session: 150+ min  Stress: Stress Concern Present (04/07/2023)   [employer] of Occupational Health - Occupational Stress Questionnaire    Feeling of Stress : Rather much  Social Connections: Socially Isolated (04/07/2023)   Social Connection and Isolation Panel [NHANES]    Frequency of Communication with Friends and Family: Once a week    Frequency of Social Gatherings with Friends and Family: More than three times a week    Attends Religious Services: Never    Database administrator or Organizations: No    Attends Engineer, structural: Not on file    Marital Status: Never married    Family History  Problem Relation Age of Onset   Heart disease Maternal Grandfather  Health Maintenance  Topic Date Due   COVID-19 Vaccine (1 - 2023-24 season) 04/23/2023 (Originally 03/02/2023)   INFLUENZA VACCINE  09/29/2023 (Originally 01/30/2023)   Hepatitis C Screening  04/06/2024 (Originally 04/20/2006)   HIV Screening  04/06/2024 (Originally 04/21/2003)   DTaP/Tdap/Td (7 - Td or Tdap) 06/04/2028   HPV VACCINES  Aged Out     ----------------------------------------------------------------------------------------------------------------------------------------------------------------------------------------------------------------- Physical Exam BP 135/79 (BP Location: Left Arm, Patient Position: Sitting, Cuff Size: Large)   Pulse 81   Ht 5\' 11"  (1.803 m)   Wt (!) 344 lb (156 kg)   SpO2 97%   BMI  47.98 kg/m   Physical Exam Constitutional:      Appearance: Normal appearance.  HENT:     Head: Normocephalic and atraumatic.  Cardiovascular:     Rate and Rhythm: Normal rate and regular rhythm.  Neurological:     General: No focal deficit present.     Mental Status: He is alert.     ------------------------------------------------------------------------------------------------------------------------------------------------------------------------------------------------------------------- Assessment and Plan  GERD (gastroesophageal reflux disease) Protonix renewed.   GAD (generalized anxiety disorder) Adding lexapro on and recommend consistent use.  Increase to 20mg  as he is currently taking 10mg .  F/u in 4-6 weeks.    Meds ordered this encounter  Medications   DISCONTD: escitalopram (LEXAPRO) 10 MG tablet    Sig: Take 1 tablet (10 mg total) by mouth as needed.    Dispense:  90 tablet    Refill:  1   escitalopram (LEXAPRO) 20 MG tablet    Sig: Take 1 tablet (20 mg total) by mouth as needed.    Dispense:  90 tablet    Refill:  1    Correct rx.   pantoprazole (PROTONIX) 40 MG tablet    Sig: Take 1 tablet (40 mg total) by mouth daily.    Dispense:  90 tablet    Refill:  1    No follow-ups on file.    This visit occurred during the SARS-CoV-2 public health emergency.  Safety protocols were in place, including screening questions prior to the visit, additional usage of staff PPE, and extensive cleaning of exam room while observing appropriate contact time as indicated for disinfecting solutions.

## 2023-04-07 NOTE — Assessment & Plan Note (Signed)
Protonix renewed.  

## 2023-05-20 ENCOUNTER — Encounter: Payer: Self-pay | Admitting: Family Medicine

## 2023-05-20 ENCOUNTER — Ambulatory Visit (INDEPENDENT_AMBULATORY_CARE_PROVIDER_SITE_OTHER): Payer: BC Managed Care – PPO | Admitting: Family Medicine

## 2023-05-20 VITALS — BP 128/80 | HR 62 | Ht 71.0 in | Wt 335.0 lb

## 2023-05-20 DIAGNOSIS — Z13220 Encounter for screening for lipoid disorders: Secondary | ICD-10-CM | POA: Diagnosis not present

## 2023-05-20 DIAGNOSIS — Z0000 Encounter for general adult medical examination without abnormal findings: Secondary | ICD-10-CM | POA: Diagnosis not present

## 2023-05-20 DIAGNOSIS — R400 Somnolence: Secondary | ICD-10-CM | POA: Diagnosis not present

## 2023-05-20 NOTE — Assessment & Plan Note (Signed)
Referral placed for sleep study.

## 2023-05-20 NOTE — Patient Instructions (Signed)
Preventive Care 21-35 Years Old, Male Preventive care refers to lifestyle choices and visits with your health care provider that can promote health and wellness. Preventive care visits are also called wellness exams. What can I expect for my preventive care visit? Counseling During your preventive care visit, your health care provider may ask about your: Medical history, including: Past medical problems. Family medical history. Current health, including: Emotional well-being. Home life and relationship well-being. Sexual activity. Lifestyle, including: Alcohol, nicotine or tobacco, and drug use. Access to firearms. Diet, exercise, and sleep habits. Safety issues such as seatbelt and bike helmet use. Sunscreen use. Work and work environment. Physical exam Your health care provider may check your: Height and weight. These may be used to calculate your BMI (body mass index). BMI is a measurement that tells if you are at a healthy weight. Waist circumference. This measures the distance around your waistline. This measurement also tells if you are at a healthy weight and may help predict your risk of certain diseases, such as type 2 diabetes and high blood pressure. Heart rate and blood pressure. Body temperature. Skin for abnormal spots. What immunizations do I need?  Vaccines are usually given at various ages, according to a schedule. Your health care provider will recommend vaccines for you based on your age, medical history, and lifestyle or other factors, such as travel or where you work. What tests do I need? Screening Your health care provider may recommend screening tests for certain conditions. This may include: Lipid and cholesterol levels. Diabetes screening. This is done by checking your blood sugar (glucose) after you have not eaten for a while (fasting). Hepatitis B test. Hepatitis C test. HIV (human immunodeficiency virus) test. STI (sexually transmitted infection)  testing, if you are at risk. Talk with your health care provider about your test results, treatment options, and if necessary, the need for more tests. Follow these instructions at home: Eating and drinking  Eat a healthy diet that includes fresh fruits and vegetables, whole grains, lean protein, and low-fat dairy products. Drink enough fluid to keep your urine pale yellow. Take vitamin and mineral supplements as recommended by your health care provider. Do not drink alcohol if your health care provider tells you not to drink. If you drink alcohol: Limit how much you have to 0-2 drinks a day. Know how much alcohol is in your drink. In the [place] one drink equals one 12 oz bottle of beer (355 mL), one 5 oz glass of wine (148 mL), or one 1 oz glass of hard liquor (44 mL). Lifestyle Brush your teeth every morning and night with fluoride toothpaste. Floss one time each day. Exercise for at least 30 minutes 5 or more days each week. Do not use any products that contain nicotine or tobacco. These products include cigarettes, chewing tobacco, and vaping devices, such as e-cigarettes. If you need help quitting, ask your health care provider. Do not use drugs. If you are sexually active, practice safe sex. Use a condom or other form of protection to prevent STIs. Find healthy ways to manage stress, such as: Meditation, yoga, or listening to music. Journaling. Talking to a trusted person. Spending time with friends and family. Minimize exposure to UV radiation to reduce your risk of skin cancer. Safety Always wear your seat belt while driving or riding in a vehicle. Do not drive: If you have been drinking alcohol. Do not ride with someone who has been drinking. If you have been using any mind-altering substances   or drugs. While texting. When you are tired or distracted. Wear a helmet and other protective equipment during sports activities. If you have firearms in your house, make sure you  follow all gun safety procedures. Seek help if you have been physically or sexually abused. What's next? Go to your health care provider once a year for an annual wellness visit. Ask your health care provider how often you should have your eyes and teeth checked. Stay up to date on all vaccines. This information is not intended to replace advice given to you by your health care provider. Make sure you discuss any questions you have with your health care provider. Document Revised: 12/13/2020 Document Reviewed: 12/13/2020 Elsevier Patient Education  2024 [employer] Inc[employer]  

## 2023-05-20 NOTE — Progress Notes (Signed)
Jerry Davis - 37 y.o. male MRN [id number]  Date of birth: April 03, 1986  Subjective No chief complaint on file.   HPI Jerry Davis is a 37 y.o. male here today for annual exam.   He reports that he is doing ok.   He does have concerns about OSA.  His STOP-BANG score is 6.  Heavy snoring and Reports some fatigue during the day.    He is somewhat active.  He feels that diet could be a little better.   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  Occasional EtOH use.   Review of Systems  Constitutional:  Negative for chills, fever, malaise/fatigue and weight loss.  HENT:  Negative for congestion, ear pain and sore throat.   Eyes:  Negative for blurred vision, double vision and pain.  Respiratory:  Negative for cough and shortness of breath.   Cardiovascular:  Negative for chest pain and palpitations.  Gastrointestinal:  Negative for abdominal pain, blood in stool, constipation, heartburn and nausea.  Genitourinary:  Negative for dysuria and urgency.  Musculoskeletal:  Negative for joint pain and myalgias.  Neurological:  Negative for dizziness and headaches.  Endo/Heme/Allergies:  Does not bruise/bleed easily.  Psychiatric/Behavioral:  Negative for depression. The patient is not nervous/anxious and does not have insomnia.     No Known Allergies  No past medical history on file.  No past surgical history on file.  Social History   Socioeconomic History  . Marital status: Single    Spouse name: Not on file  . Number of children: Not on file  . Years of education: Not on file  . Highest education level: GED or equivalent  Occupational History  . Not on file  Tobacco Use  . Smoking status: Former    Current packs/day: 0.00    Types: Cigarettes    Quit date: 11/30/2019    Years since quitting: 3.4  . Smokeless tobacco: Never  Vaping Use  . Vaping status: Every Day  . Substances: Nicotine  Substance and Sexual Activity  . Alcohol use: Yes     Alcohol/week: 1.0 - 2.0 standard drink of alcohol    Types: 1 - 2 Standard drinks or equivalent per week  . Drug use: Not Currently  . Sexual activity: Not Currently    Partners: Female  Other Topics Concern  . Not on file  Social History Narrative  . Not on file   Social Determinants of Health   Financial Resource Strain: Low Risk  (05/19/2023)   Overall Financial Resource Strain (CARDIA)   . Difficulty of Paying Living Expenses: Not very hard  Recent Concern: Financial Resource Strain - Medium Risk (04/07/2023)   Overall Financial Resource Strain (CARDIA)   . Difficulty of Paying Living Expenses: Somewhat hard  Food Insecurity: No Food Insecurity (05/19/2023)   Hunger Vital Sign   . Worried About Programme researcher, broadcasting/film/video in the Last Year: Never true   . Ran Out of Food in the Last Year: Never true  Recent Concern: Food Insecurity - Food Insecurity Present (04/07/2023)   Hunger Vital Sign   . Worried About Programme researcher, broadcasting/film/video in the Last Year: Sometimes true   . Ran Out of Food in the Last Year: Never true  Transportation Needs: No Transportation Needs (05/19/2023)   PRAPARE - Transportation   . Lack of Transportation (Medical): No   . Lack of Transportation (Non-Medical): No  Physical Activity: Sufficiently Active (05/19/2023)   Exercise Vital  Sign   . Days of Exercise per Week: 5 days   . Minutes of Exercise per Session: 60 min  Stress: No Stress Concern Present (05/19/2023)   [employer] of Occupational Health - Occupational Stress Questionnaire   . Feeling of Stress : Only a little  Recent Concern: Stress - Stress Concern Present (04/07/2023)   [employer] of Occupational Health - Occupational Stress Questionnaire   . Feeling of Stress : Rather much  Social Connections: Socially Isolated (05/19/2023)   Social Connection and Isolation Panel [NHANES]   . Frequency of Communication with Friends and Family: Once a week   . Frequency of Social Gatherings with Friends  and Family: Once a week   . Attends Religious Services: Never   . Active Member of Clubs or Organizations: No   . Attends Banker Meetings: Not on file   . Marital Status: Never married    Family History  Problem Relation Age of Onset  . Heart disease Maternal Grandfather     Health Maintenance  Topic Date Due  . COVID-19 Vaccine (1 - 2023-24 season) Never done  . INFLUENZA VACCINE  09/29/2023 (Originally 01/30/2023)  . Hepatitis C Screening  04/06/2024 (Originally 04/20/2006)  . HIV Screening  04/06/2024 (Originally 04/21/2003)  . DTaP/Tdap/Td (7 - Td or Tdap) 06/04/2028  . HPV VACCINES  Aged Out     ----------------------------------------------------------------------------------------------------------------------------------------------------------------------------------------------------------------- Physical Exam There were no vitals taken for this visit.  Physical Exam Constitutional:      General: He is not in acute distress. HENT:     Head: Normocephalic and atraumatic.     Right Ear: Tympanic membrane and external ear normal.     Left Ear: Tympanic membrane and external ear normal.  Eyes:     General: No scleral icterus. Neck:     Thyroid: No thyromegaly.  Cardiovascular:     Rate and Rhythm: Normal rate and regular rhythm.     Heart sounds: Normal heart sounds.  Pulmonary:     Effort: Pulmonary effort is normal.     Breath sounds: Normal breath sounds.  Abdominal:     General: Bowel sounds are normal. There is no distension.     Palpations: Abdomen is soft.     Tenderness: There is no abdominal tenderness. There is no guarding.  Musculoskeletal:     Cervical back: Normal range of motion.  Lymphadenopathy:     Cervical: No cervical adenopathy.  Skin:    General: Skin is warm and dry.     Findings: No rash.  Neurological:     Mental Status: He is alert and oriented to person, place, and time.     Cranial Nerves: No cranial nerve deficit.      Motor: No abnormal muscle tone.  Psychiatric:        Mood and Affect: Mood normal.        Behavior: Behavior normal.    ------------------------------------------------------------------------------------------------------------------------------------------------------------------------------------------------------------------- Assessment and Plan  Well adult exam Well adult Orders Placed This Encounter  Procedures  . CMP14+EGFR  . CBC with Differential/Platelet  . Lipid Panel With LDL/HDL Ratio  . Ambulatory referral to Sleep Studies    Referral Priority:   Routine    Referral Type:   Consultation    Referral Reason:   Specialty Services Required    Number of Visits Requested:   1  Screenings: per lab orders Immunization:  Declines Anticipatory guidance/Risk factor reduction:  Recommendations per AVS.   Daytime somnolence Referral placed for sleep study.  No orders of the defined types were placed in this encounter.   No follow-ups on file.    This visit occurred during the SARS-CoV-2 public health emergency.  Safety protocols were in place, including screening questions prior to the visit, additional usage of staff PPE, and extensive cleaning of exam room while observing appropriate contact time as indicated for disinfecting solutions.

## 2023-05-20 NOTE — Assessment & Plan Note (Signed)
Well adult Orders Placed This Encounter  Procedures   CMP14+EGFR   CBC with Differential/Platelet   Lipid Panel With LDL/HDL Ratio   Ambulatory referral to Sleep Studies    Referral Priority:   Routine    Referral Type:   Consultation    Referral Reason:   Specialty Services Required    Number of Visits Requested:   1  Screenings: per lab orders Immunization:  Declines Anticipatory guidance/Risk factor reduction:  Recommendations per AVS.

## 2023-05-21 LAB — CMP14+EGFR
ALT: 22 [IU]/L (ref 0–44)
AST: 18 [IU]/L (ref 0–40)
Albumin: 4.6 g/dL (ref 4.1–5.1)
Alkaline Phosphatase: 71 [IU]/L (ref 44–121)
BUN/Creatinine Ratio: 17 (ref 9–20)
BUN: 17 mg/dL (ref 6–20)
Bilirubin Total: 0.4 mg/dL (ref 0.0–1.2)
CO2: 21 mmol/L (ref 20–29)
Calcium: 9.3 mg/dL (ref 8.7–10.2)
Chloride: 105 mmol/L (ref 96–106)
Creatinine, Ser: 1.02 mg/dL (ref 0.76–1.27)
Globulin, Total: 2.2 g/dL (ref 1.5–4.5)
Glucose: 85 mg/dL (ref 70–99)
Potassium: 4.2 mmol/L (ref 3.5–5.2)
Sodium: 141 mmol/L (ref 134–144)
Total Protein: 6.8 g/dL (ref 6.0–8.5)
eGFR: 98 mL/min/{1.73_m2} (ref >59)

## 2023-05-21 LAB — CBC WITH DIFFERENTIAL/PLATELET
Basophils Absolute: 0 10*3/uL (ref 0.0–0.2)
Basos: 1 %
EOS (ABSOLUTE): 0.1 10*3/uL (ref 0.0–0.4)
Eos: 3 %
Hematocrit: 45.1 % (ref 37.5–51.0)
Hemoglobin: 15.1 g/dL (ref 13.0–17.7)
Immature Grans (Abs): 0 10*3/uL (ref 0.0–0.1)
Immature Granulocytes: 0 %
Lymphocytes Absolute: 1.5 10*3/uL (ref 0.7–3.1)
Lymphs: 29 %
MCH: 29.6 pg (ref 26.6–33.0)
MCHC: 33.5 g/dL (ref 31.5–35.7)
MCV: 88 fL (ref 79–97)
Monocytes Absolute: 0.5 10*3/uL (ref 0.1–0.9)
Monocytes: 9 %
Neutrophils Absolute: 3 10*3/uL (ref 1.4–7.0)
Neutrophils: 58 %
Platelets: 176 10*3/uL (ref 150–450)
RBC: 5.1 x10E6/uL (ref 4.14–5.80)
RDW: 12.3 % (ref 11.6–15.4)
WBC: 5.1 10*3/uL (ref 3.4–10.8)

## 2023-05-21 LAB — LIPID PANEL WITH LDL/HDL RATIO
Cholesterol, Total: 218 mg/dL — ABNORMAL HIGH (ref 100–199)
HDL: 45 mg/dL (ref >39)
LDL Chol Calc (NIH): 157 mg/dL — ABNORMAL HIGH (ref 0–99)
LDL/HDL Ratio: 3.5 ratio (ref 0.0–3.6)
Triglycerides: 91 mg/dL (ref 0–149)
VLDL Cholesterol Cal: 16 mg/dL (ref 5–40)

## 2023-06-03 ENCOUNTER — Ambulatory Visit (INDEPENDENT_AMBULATORY_CARE_PROVIDER_SITE_OTHER): Payer: BC Managed Care – PPO | Admitting: Neurology

## 2023-06-03 ENCOUNTER — Encounter: Payer: Self-pay | Admitting: Neurology

## 2023-06-03 VITALS — BP 142/92 | HR 74 | Ht 71.0 in | Wt 340.6 lb

## 2023-06-03 DIAGNOSIS — Z9189 Other specified personal risk factors, not elsewhere classified: Secondary | ICD-10-CM | POA: Diagnosis not present

## 2023-06-03 DIAGNOSIS — R0683 Snoring: Secondary | ICD-10-CM | POA: Diagnosis not present

## 2023-06-03 DIAGNOSIS — R0681 Apnea, not elsewhere classified: Secondary | ICD-10-CM

## 2023-06-03 DIAGNOSIS — Z820 Family history of epilepsy and other diseases of the nervous system: Secondary | ICD-10-CM | POA: Diagnosis not present

## 2023-06-03 DIAGNOSIS — R519 Headache, unspecified: Secondary | ICD-10-CM

## 2023-06-03 DIAGNOSIS — Z6841 Body Mass Index (BMI) 40.0 and over, adult: Secondary | ICD-10-CM

## 2023-06-03 DIAGNOSIS — G4719 Other hypersomnia: Secondary | ICD-10-CM | POA: Diagnosis not present

## 2023-06-03 NOTE — Progress Notes (Signed)
Subjective:    Patient ID: Jerry Davis is a 37 y.o. male.  HPI    Jerry Foley, MD, PhD [facility] [address] [address] [place], [place] [address]  Dear Dr. Ashley Royalty,  I saw your patient, Jerry Davis, upon your kind request, in my sleep clinic today for initial consultation of his sleep disorder, in particular, concern for underlying obstructive sleep apnea.  The patient is unaccompanied today.  As you know, Jerry Davis is a 37 year old male with an underlying medical history of reflux disease, smoking, anxiety, depression, and severe obesity with a BMI of over 45, who reports snoring and excessive daytime somnolence, as well as witnessed apneas per family.  He also reports a family history of sleep apnea in his father.  His Epworth sleepiness score is 9 out of 24, fatigue severity score is 34 out of 63.  I reviewed your office note from 05/20/2023.  He has occasional morning headaches and sometimes over-the-counter medication for these.  He denies nightly nocturia.  He lives with his brother and his sister as well as 2 cousins.  He is single, 2 pets in the household, 1 dog, 1 cat, dog typically sleeps on the bed with him.  He has a TV in his bedroom and turns it off when he is ready to fall asleep.  Bedtime is around 1030 and he turns his TV off around that time.  Rise time is 5:15 AM.  He works as a Landscape architect.  He does not smoke cigarettes regularly with the exception of occasional cigarettes, but does vape daily.  He drinks quite a bit of caffeine in the form of coffee in the morning, about 24 ounces and about a liter of soda per day.  He is working on weight loss, his highest weight was about 2 and half years ago, around 400 pounds.  His Past Medical History Is Significant For: Past Medical History:  Diagnosis Date   Anxiety    Depression     His Past Surgical History Is Significant For: History reviewed. No  pertinent surgical history.  His Family History Is Significant For: Family History  Problem Relation Age of Onset   Sleep apnea Father    Cancer Maternal Grandfather    Heart disease Maternal Grandfather     His Social History Is Significant For: Social History   Socioeconomic History   Marital status: Single    Spouse name: Not on file   Number of children: Not on file   Years of education: Not on file   Highest education level: GED or equivalent  Occupational History   Not on file  Tobacco Use   Smoking status: Former    Current packs/day: 0.00    Types: Cigarettes    Quit date: 11/30/2019    Years since quitting: 3.5   Smokeless tobacco: Never  Vaping Use   Vaping status: Every Day   Substances: Nicotine  Substance and Sexual Activity   Alcohol use: Not Currently   Drug use: Not Currently    Types: Marijuana   Sexual activity: Not Currently    Partners: Female  Other Topics Concern   Not on file  Social History Narrative   Caffiene coffee 1-2 12 0z cups.   Soda's liter   Working:  Arts administrator   Live family sister , brothers, cousins   Social Determinants of Health   Financial Resource Strain: Low Risk  (05/19/2023)   Overall Financial  Resource Strain (CARDIA)    Difficulty of Paying Living Expenses: Not very hard  Recent Concern: Financial Resource Strain - Medium Risk (04/07/2023)   Overall Financial Resource Strain (CARDIA)    Difficulty of Paying Living Expenses: Somewhat hard  Food Insecurity: No Food Insecurity (05/19/2023)   Hunger Vital Sign    Worried About Running Out of Food in the Last Year: Never true    Ran Out of Food in the Last Year: Never true  Recent Concern: Food Insecurity - Food Insecurity Present (04/07/2023)   Hunger Vital Sign    Worried About Running Out of Food in the Last Year: Sometimes true    Ran Out of Food in the Last Year: Never true  Transportation Needs: No Transportation Needs (05/19/2023)   PRAPARE -  Administrator, Civil Service (Medical): No    Lack of Transportation (Non-Medical): No  Physical Activity: Sufficiently Active (05/19/2023)   Exercise Vital Sign    Days of Exercise per Week: 5 days    Minutes of Exercise per Session: 60 min  Stress: No Stress Concern Present (05/19/2023)   [employer] of Occupational Health - Occupational Stress Questionnaire    Feeling of Stress : Only a little  Recent Concern: Stress - Stress Concern Present (04/07/2023)   [employer] of Occupational Health - Occupational Stress Questionnaire    Feeling of Stress : Rather much  Social Connections: Socially Isolated (05/19/2023)   Social Connection and Isolation Panel [NHANES]    Frequency of Communication with Friends and Family: Once a week    Frequency of Social Gatherings with Friends and Family: Once a week    Attends Religious Services: Never    Database administrator or Organizations: No    Attends Engineer, structural: Not on file    Marital Status: Never married    His Allergies Are:  No Known Allergies:   His Current Medications Are:  Outpatient Encounter Medications as of 06/03/2023  Medication Sig   escitalopram (LEXAPRO) 20 MG tablet Take 1 tablet (20 mg total) by mouth as needed.   pantoprazole (PROTONIX) 40 MG tablet Take 1 tablet (40 mg total) by mouth daily.   No facility-administered encounter medications on file as of 06/03/2023.  :   Review of Systems:  Out of a complete 14 point review of systems, all are reviewed and negative with the exception of these symptoms as listed below:   Review of Systems  Neurological:        Snoring , fatigue, apnea, does not feel rested. No sleep study done.  ESS 9 FSS 34.    Objective:  Neurological Exam  Physical Exam Physical Examination:   Vitals:   06/03/23 1318  BP: (!) 142/92  Pulse: 74    General Examination: The patient is a very pleasant 37 y.o. male in no acute distress. He appears  well-developed and well-nourished and well groomed.   HEENT: Normocephalic, atraumatic, pupils are equal, round and reactive to light, extraocular tracking is good without limitation to gaze excursion or nystagmus noted. Hearing is grossly intact. Face is symmetric with normal facial animation. Speech is clear with no dysarthria noted. There is no hypophonia. There is no lip, neck/head, jaw or voice tremor. Neck is supple with full range of passive and active motion. There are no carotid bruits on auscultation. Oropharynx exam reveals: mild mouth dryness, adequate dental hygiene with several teeth missing, mild airway crowding secondary to tonsillar size of about plus and  thicker tongue, Mallampati class I.  Neck circumference 20 inches, no significant overbite noted, missing top front tooth.  Tongue protrudes centrally and palate elevates symmetrically.    Chest: Clear to auscultation without wheezing, rhonchi or crackles noted.  Heart: S1+S2+0, regular and normal without murmurs, rubs or gallops noted.   Abdomen: Soft, non-tender and non-distended.  Extremities: There is no pitting edema in the distal lower extremities bilaterally.   Skin: Warm and dry without trophic changes noted.   Musculoskeletal: exam reveals no obvious joint deformities.   Neurologically:  Mental status: The patient is awake, alert and oriented in all 4 spheres. His immediate and remote memory, attention, language skills and fund of knowledge are appropriate. There is no evidence of aphasia, agnosia, apraxia or anomia. Speech is clear with normal prosody and enunciation. Thought process is linear. Mood is normal and affect is normal.  Cranial nerves II - XII are as described above under HEENT exam.  Motor exam: Normal bulk, strength and tone is noted. There is no obvious action or resting tremor.  Fine motor skills and coordination: grossly intact.  Cerebellar testing: No dysmetria or intention tremor. There is no  truncal or gait ataxia.  Sensory exam: intact to light touch in the upper and lower extremities.  Gait, station and balance: He stands easily. No veering to one side is noted. No leaning to one side is noted. Posture is age-appropriate and stance is narrow based. Gait shows normal stride length and normal pace. No problems turning are noted.   Assessment and Plan:  In summary, Jerry Davis is a very pleasant 37 y.o.-year old male with an underlying medical history of reflux disease, smoking, anxiety, depression, and severe obesity with a BMI of over 45, whose history and physical exam are concerning for sleep disordered breathing, particularly obstructive sleep apnea (OSA).  While a laboratory attended sleep study is typically considered "gold standard" for evaluation of sleep disordered breathing, we mutually agreed to proceed with a home sleep test at this time.   I had a long chat with the patient about my findings and the diagnosis of sleep apnea, particularly OSA, its prognosis and treatment options. We talked about medical/conservative treatments, surgical interventions and non-pharmacological approaches for symptom control. I explained, in particular, the risks and ramifications of untreated moderate to severe OSA, especially with respect to developing cardiovascular disease down the road, including congestive heart failure (CHF), difficult to treat hypertension, cardiac arrhythmias (particularly A-fib), neurovascular complications including TIA, stroke and dementia. Even type 2 diabetes has, in part, been linked to untreated OSA. Symptoms of untreated OSA may include (but may not be limited to) daytime sleepiness, nocturia (i.e. frequent nighttime urination), memory problems, mood irritability and suboptimally controlled or worsening mood disorder such as depression and/or anxiety, lack of energy, lack of motivation, physical discomfort, as well as recurrent headaches, especially morning or  nocturnal headaches. We talked about the importance of maintaining a healthy lifestyle and striving for healthy weight.  The importance of complete smoking and nicotine cessation was also addressed.  In addition, we talked about the importance of striving for and maintaining good sleep hygiene. I recommended a sleep study at this time. I outlined the differences between a laboratory attended sleep study which is considered more comprehensive and accurate over the option of a home sleep test (HST); the latter may lead to underestimation of sleep disordered breathing in some instances and does not help with diagnosing upper airway resistance syndrome and is not accurate  enough to diagnose primary central sleep apnea typically. I outlined possible surgical and non-surgical treatment options of OSA, including the use of a positive airway pressure (PAP) device (i.e. CPAP, AutoPAP/APAP or BiPAP in certain circumstances), a custom-made dental device (aka oral appliance, which would require a referral to a specialist dentist or orthodontist typically, and is generally speaking not considered for patients with full dentures or edentulous state), upper airway surgical options, such as traditional UPPP (which is not considered a first-line treatment) or the Inspire device (hypoglossal nerve stimulator, which would involve a referral for consultation with an ENT surgeon, after careful selection, following inclusion criteria - also not first-line treatment). I explained the PAP treatment option to the patient in detail, as this is generally considered first-line treatment.  The patient indicated that he would be willing to try PAP therapy, if the need arises. I explained the importance of being compliant with PAP treatment, not only for insurance purposes but primarily to improve patient's symptoms symptoms, and for the patient's long term health benefit, including to reduce His cardiovascular risks longer-term.    We will  pick up our discussion about the next steps and treatment options after testing.  We will keep him posted as to the test results by phone call and/or MyChart messaging where possible.  We will plan to follow-up in sleep clinic accordingly as well.  I answered all his questions today and the patient was in agreement.   I encouraged him to call with any interim questions, concerns, problems or updates or email [place] through MyChart.  Generally speaking, sleep test authorizations may take up to 2 weeks, sometimes less, sometimes longer, the patient is encouraged to get in touch with [place] if they do not hear back from the sleep lab staff directly within the next 2 weeks.  Thank you very much for allowing me to participate in the care of this nice patient. If I can be of any further assistance to you please do not hesitate to call me at [phone].  Sincerely,   Jerry Foley, MD, PhD

## 2023-06-03 NOTE — Patient Instructions (Signed)

## 2023-06-06 ENCOUNTER — Ambulatory Visit: Payer: BC Managed Care – PPO | Admitting: Neurology

## 2023-06-06 DIAGNOSIS — G4733 Obstructive sleep apnea (adult) (pediatric): Secondary | ICD-10-CM | POA: Diagnosis not present

## 2023-06-06 DIAGNOSIS — R519 Headache, unspecified: Secondary | ICD-10-CM

## 2023-06-06 DIAGNOSIS — R0683 Snoring: Secondary | ICD-10-CM

## 2023-06-06 DIAGNOSIS — R0681 Apnea, not elsewhere classified: Secondary | ICD-10-CM

## 2023-06-06 DIAGNOSIS — G4719 Other hypersomnia: Secondary | ICD-10-CM

## 2023-06-06 DIAGNOSIS — Z820 Family history of epilepsy and other diseases of the nervous system: Secondary | ICD-10-CM

## 2023-06-06 DIAGNOSIS — Z9189 Other specified personal risk factors, not elsewhere classified: Secondary | ICD-10-CM

## 2023-06-18 NOTE — Procedures (Signed)
[facility]  HOME SLEEP TEST (SANSA) REPORT (Mail-Out Device):   STUDY DATE: 06/11/2023  DOB: 1985-10-10  MRN: [id number]  ORDERING CLINICIAN: Huston Foley, MD, PhD   REFERRING CLINICIAN: Everrett Coombe, DO   CLINICAL INFORMATION/HISTORY: 37 year old male with an underlying medical history of reflux disease, smoking, anxiety, depression, and severe obesity with a BMI of over 45, who reports snoring and excessive daytime somnolence, as well as witnessed apneas per family.  He also reports a family history of sleep apnea in his father.    PATIENT'S LAST REPORTED EPWORTH SLEEPINESS SCORE (ESS): 9/24.  BMI (at the time of sleep clinic visit and/or test date): 47.5 kg/m  FINDINGS:   Study Protocol:    The SANSA single-point-of-skin-contact chest-worn sensor - an FDA and DOT approved type 4 home sleep test device - measures eight physiological channels,  including blood oxygen saturation (measured via PPG [photoplethysmography]), EKG-derived heart rate, respiratory effort, chest movement (measured via accelerometer), snoring, body position, and actigraphy. The device is designed to be worn for up to 10 hours per study.   Sleep Summary:   Total Recording Time (hours, min): 8 hours, 35 min  Total Effective Sleep Time (hours, min):  6 hours, 28 min  Sleep Efficiency (%):    75%   Respiratory Indices:   Calculated sAHI (per hour):  19.6/hour         Oxygen Saturation Statistics:    Oxygen Saturation (%) Mean: 93.3%   Minimum oxygen saturation (%):                 72.1%   O2 Saturation Range (%): 72.1-99.7%    Pulse Rate Statistics:   Pulse Mean (bpm):    59/min    Pulse Range (45-120/min)   Snoring: Mild to moderate  IMPRESSION/DIAGNOSES:   OSA (obstructive sleep apnea), moderate   RECOMMENDATIONS:   This home sleep test demonstrates moderate obstructive sleep apnea with a total AHI of 19.6/hour and O2 nadir of 72.1%.  Mild to moderate snoring  was detected. Treatment with a positive airway pressure (PAP) device is recommended. The patient will be advised to proceed with an autoPAP titration/trial at home for now. A full night titration study may be considered to optimize treatment settings, monitor proper oxygen saturations and aid with improvement of tolerance and adherence, if needed down the road. Alternative treatment options may include a dental device through dentistry or orthodontics in selected patients or Inspire (hypoglossal nerve stimulator) in carefully selected patients (meeting inclusion criteria).  Concomitant weight loss is recommended (where clinically appropriate). Please note that untreated obstructive sleep apnea may carry additional perioperative morbidity. Patients with significant obstructive sleep apnea should receive perioperative PAP therapy and the surgeons and particularly the anesthesiologist should be informed of the diagnosis and the severity of the sleep disordered breathing. The patient should be cautioned not to drive, work at heights, or operate dangerous or heavy equipment when tired or sleepy. Review and reiteration of good sleep hygiene measures should be pursued with any patient. Other causes of the patient's symptoms, including circadian rhythm disturbances, an underlying mood disorder, medication effect and/or an underlying medical problem cannot be ruled out based on this test. Clinical correlation is recommended.  The patient and his referring provider will be notified of the test results. The patient will be seen in follow up in sleep clinic at [facility].  I certify that I have reviewed the raw data recording prior to the issuance of this report in accordance with the  standards of the American Academy of Sleep Medicine (AASM).    INTERPRETING PHYSICIAN:   Huston Foley, MD, PhD Medical Director, [facility]) Diplomat, ABPN (Neurology and Sleep)   [facility] [address], [address] [place], [place] [address] [phone]

## 2023-06-18 NOTE — Addendum Note (Signed)
Addended by: Huston Foley on: 06/18/2023 12:56 PM   Modules accepted: Orders

## 2023-06-19 ENCOUNTER — Telehealth: Payer: Self-pay | Admitting: *Deleted

## 2023-06-19 NOTE — Telephone Encounter (Signed)
 Jerry Davis, Jerry Davis Fulling, Jerry Jefferson, RN; [place], [place] Got It Thank  You

## 2023-06-19 NOTE — Telephone Encounter (Signed)
Informed pt of their sleep study results. The study showed moderate OSA . Per Dr. Teofilo Pod recommendations, the pt was advised to start autopap.  He has agreed to proceed with this and we discussed the insurance compliance requirements which includes using the machine at least 4 hours at night and also being seen by our office between 30 and 90 days after set up.  He has been scheduled for a 7:45 AM appointment on 09/08/2023.  I have sent his referral to [facility] and his sleep study result has been sent to his referring provider. Pt thanked me for the call.

## 2023-06-19 NOTE — Telephone Encounter (Signed)
-----   Message from Huston Foley sent at 06/18/2023 12:56 PM EST ----- Patient referred by PCP, seen by me on 06/03/2023, patient had a HST on 06/11/2023.    Please call and notify the patient that the recent home sleep test showed obstructive sleep apnea in the moderate range. I recommend treatment in the form of autoPAP, which means, that we don't have to bring him in for a sleep study with CPAP, but will let him start using a so called autoPAP machine at home, which is a CPAP-like machine with self-adjusting pressures. We will send the order to a local DME company (of his choice, or as per insurance requirement). The DME representative will fit him with a mask, educate him on how to use the machine, how to put the mask on, etc. I have placed an order in the chart. Please send the order, talk to patient, send report to referring MD. We will need a FU in sleep clinic for 10 weeks post-PAP set up, please arrange that with me or one of our NPs. Also reinforce the need for compliance with treatment. Thanks,   Huston Foley, MD, PhD [facility])

## 2023-07-07 DIAGNOSIS — R400 Somnolence: Secondary | ICD-10-CM | POA: Diagnosis not present

## 2023-07-07 DIAGNOSIS — G4733 Obstructive sleep apnea (adult) (pediatric): Secondary | ICD-10-CM | POA: Diagnosis not present

## 2023-08-07 DIAGNOSIS — R400 Somnolence: Secondary | ICD-10-CM | POA: Diagnosis not present

## 2023-08-07 DIAGNOSIS — G4733 Obstructive sleep apnea (adult) (pediatric): Secondary | ICD-10-CM | POA: Diagnosis not present

## 2023-09-04 DIAGNOSIS — G4733 Obstructive sleep apnea (adult) (pediatric): Secondary | ICD-10-CM | POA: Diagnosis not present

## 2023-09-04 DIAGNOSIS — R400 Somnolence: Secondary | ICD-10-CM | POA: Diagnosis not present

## 2023-09-08 ENCOUNTER — Telehealth: Payer: Self-pay | Admitting: Neurology

## 2023-09-08 ENCOUNTER — Ambulatory Visit: Payer: BC Managed Care – PPO | Admitting: Neurology

## 2023-09-08 NOTE — Telephone Encounter (Signed)
LVM and sent mychart msg- MD out.

## 2023-09-23 ENCOUNTER — Encounter: Payer: Self-pay | Admitting: Anesthesiology

## 2023-09-23 ENCOUNTER — Ambulatory Visit: Admitting: Neurology

## 2023-09-24 ENCOUNTER — Other Ambulatory Visit: Payer: Self-pay | Admitting: Family Medicine

## 2023-09-24 NOTE — Progress Notes (Unsigned)
 Patient: Jerry Davis Date of Birth: 01/28/1986  Reason for Visit: Follow up History from: Patient Primary Neurologist: Jerry Davis   Virtual Visit via Video Note  I connected with Jerry Davis on 09/24/23 at  4:00 PM EDT by a video enabled telemedicine application and verified that I am speaking with the correct person using two identifiers.  Location: Patient: at his home Provider: in the office    I discussed the limitations of evaluation and management by telemedicine and the availability of in person appointments. The patient expressed understanding and agreed to proceed.  ASSESSMENT AND PLAN 38 y.o. year old male   1.  Moderate OSA on CPAP (set up date 07/07/2023)  -Encouraged to continue using nightly.  He is very motivated to use more than 4 hours.  Over the last month he has been taking his CPAP off during his sleep and not realizing.  I have recommended that he order a new CPAP mask from his DME, ensuring that everything is tight enough and has a good seal.  He is satisfied with a nasal pillow mask.  I will pull a CPAP download in 1 month to review the compliance data.  We discussed the importance to use nightly for minimum 4 hours.  He is quite motivated to continue CPAP use.  Even in the first month he had super benefit.  We will follow-up in 6 months for CPAP virtually.  HISTORY OF PRESENT ILLNESS: Today 09/24/23 Saw Dr. Frances Davis in December 2024 for reporting snoring and daytime somnolence, witnessed apneas.  Had HST 06/11/2023 showing moderate OSA with a total AHI of 19.6/hour and O2 nadir of 72.1%. Setup date 07/07/23. 1st month CPAP doing great, 2nd month during the night taking CPAP off, unclear why? Most nights still taking it off and doesn't realize it until next morning. During 1st month more restorative sleep, more energy as he was getting more sleep. Using nasal pillow mask. He had super compliance the 1st month.  Going to bed with it on every night, motivated to  use nightly.  Below is a 60-day compliance, 1st 30 day compliance was 100 % use, > 4 hours 90%, AHI 2.1, leak 11.4.      HISTORY  06/03/23 Dr. Frances Davis: I saw your patient, Jerry Davis, upon your kind request, in my sleep clinic today for initial consultation of his sleep disorder, in particular, concern for underlying obstructive sleep apnea.  The patient is unaccompanied today.  As you know, Jerry Davis is a 38 year old male with an underlying medical history of reflux disease, smoking, anxiety, depression, and severe obesity with a BMI of over 45, who reports snoring and excessive daytime somnolence, as well as witnessed apneas per family.  He also reports a family history of sleep apnea in his father.  His Epworth sleepiness score is 9 out of 24, fatigue severity score is 34 out of 63.  I reviewed your office note from 05/20/2023.  He has occasional morning headaches and sometimes over-the-counter medication for these.  He denies nightly nocturia.  He lives with his brother and his sister as well as 2 cousins.  He is single, 2 pets in the household, 1 dog, 1 cat, dog typically sleeps on the bed with him.  He has a TV in his bedroom and turns it off when he is ready to fall asleep.  Bedtime is around 1030 and he turns his TV off around that time.  Rise time is 5:15 AM.  He works  as a Landscape architect.  He does not smoke cigarettes regularly with the exception of occasional cigarettes, but does vape daily.  He drinks quite a bit of caffeine in the form of coffee in the morning, about 24 ounces and about a liter of soda per day.  He is working on weight loss, his highest weight was about 2 and half years ago, around 400 pounds.   REVIEW OF SYSTEMS: Out of a complete 14 system review of symptoms, the patient complains only of the following symptoms, and all other reviewed systems are negative.  See HPI  ALLERGIES: No Known Allergies  HOME MEDICATIONS: Outpatient Medications Prior  to Visit  Medication Sig Dispense Refill   escitalopram (LEXAPRO) 20 MG tablet Take 1 tablet (20 mg total) by mouth as needed. 90 tablet 1   pantoprazole (PROTONIX) 40 MG tablet Take 1 tablet (40 mg total) by mouth daily. 90 tablet 1   No facility-administered medications prior to visit.    PAST MEDICAL HISTORY: Past Medical History:  Diagnosis Date   Anxiety    Depression     PAST SURGICAL HISTORY: No past surgical history on file.  FAMILY HISTORY: Family History  Problem Relation Age of Onset   Sleep apnea Father    Cancer Maternal Grandfather    Heart disease Maternal Grandfather     SOCIAL HISTORY: Social History   Socioeconomic History   Marital status: Single    Spouse name: Not on file   Number of children: Not on file   Years of education: Not on file   Highest education level: GED or equivalent  Occupational History   Not on file  Tobacco Use   Smoking status: Former    Current packs/day: 0.00    Types: Cigarettes    Quit date: 11/30/2019    Years since quitting: 3.8   Smokeless tobacco: Never  Vaping Use   Vaping status: Every Day   Substances: Nicotine  Substance and Sexual Activity   Alcohol use: Not Currently   Drug use: Not Currently    Types: Marijuana   Sexual activity: Not Currently    Partners: Female  Other Topics Concern   Not on file  Social History Narrative   Caffiene coffee 1-2 12 0z cups.   Soda's liter   Working:  Arts administrator   Live family sister , brothers, cousins   Social Drivers of Corporate investment banker Strain: Low Risk  (05/19/2023)   Overall Financial Resource Strain (CARDIA)    Difficulty of Paying Living Expenses: Not very hard  Recent Concern: Financial Resource Strain - Medium Risk (04/07/2023)   Overall Financial Resource Strain (CARDIA)    Difficulty of Paying Living Expenses: Somewhat hard  Food Insecurity: No Food Insecurity (05/19/2023)   Hunger Vital Sign    Worried About Running Out of Food  in the Last Year: Never true    Ran Out of Food in the Last Year: Never true  Recent Concern: Food Insecurity - Food Insecurity Present (04/07/2023)   Hunger Vital Sign    Worried About Running Out of Food in the Last Year: Sometimes true    Ran Out of Food in the Last Year: Never true  Transportation Needs: No Transportation Needs (05/19/2023)   PRAPARE - Administrator, Civil Service (Medical): No    Lack of Transportation (Non-Medical): No  Physical Activity: Sufficiently Active (05/19/2023)   Exercise Vital Sign    Days of Exercise per Week:  5 days    Minutes of Exercise per Session: 60 min  Stress: No Stress Concern Present (05/19/2023)   [employer] of Occupational Health - Occupational Stress Questionnaire    Feeling of Stress : Only a little  Recent Concern: Stress - Stress Concern Present (04/07/2023)   [employer] of Occupational Health - Occupational Stress Questionnaire    Feeling of Stress : Rather much  Social Connections: Socially Isolated (05/19/2023)   Social Connection and Isolation Panel [NHANES]    Frequency of Communication with Friends and Family: Once a week    Frequency of Social Gatherings with Friends and Family: Once a week    Attends Religious Services: Never    Database administrator or Organizations: No    Attends Engineer, structural: Not on file    Marital Status: Never married  Intimate Partner Violence: Unknown (10/03/2021)   Received from [employer], [facility]   HITS    Physically Hurt: Not on file    Insult or Talk Down To: Not on file    Threaten Physical Harm: Not on file    Scream or Curse: Not on file    PHYSICAL EXAM  There were no vitals filed for this visit. There is no height or weight on file to calculate BMI.  Generalized: Well developed, in no acute distress  Via video visit, is alert and oriented, speech is clear and concise, facial symmetry noted, moves about freely  DIAGNOSTIC DATA (LABS,  IMAGING, TESTING) - I reviewed patient records, labs, notes, testing and imaging myself where available.  Lab Results  Component Value Date   WBC 5.1 05/20/2023   HGB 15.1 05/20/2023   HCT 45.1 05/20/2023   MCV 88 05/20/2023   PLT 176 05/20/2023      Component Value Date/Time   NA 141 05/20/2023 1138   K 4.2 05/20/2023 1138   CL 105 05/20/2023 1138   CO2 21 05/20/2023 1138   GLUCOSE 85 05/20/2023 1138   GLUCOSE 105 (H) 06/22/2020 0000   BUN 17 05/20/2023 1138   CREATININE 1.02 05/20/2023 1138   CREATININE 1.05 06/22/2020 0000   CALCIUM 9.3 05/20/2023 1138   PROT 6.8 05/20/2023 1138   ALBUMIN 4.6 05/20/2023 1138   AST 18 05/20/2023 1138   ALT 22 05/20/2023 1138   ALKPHOS 71 05/20/2023 1138   BILITOT 0.4 05/20/2023 1138   GFRNONAA 93 06/22/2020 0000   GFRAA 108 06/22/2020 0000   Lab Results  Component Value Date   CHOL 218 (H) 05/20/2023   HDL 45 05/20/2023   LDLCALC 157 (H) 05/20/2023   TRIG 91 05/20/2023   No results found for: "HGBA1C" No results found for: "VITAMINB12" Lab Results  Component Value Date   TSH 1.85 06/22/2020    Margie Ege, AGNP-C, DNP 09/24/2023, 4:08 PM [facility] [address] [place], [place] [address] [phone]

## 2023-09-24 NOTE — Telephone Encounter (Signed)
 Copied from CRM [id number]. Topic: Clinical - Medication Refill >> Sep 24, 2023  1:35 PM Izetta Dakin wrote: Most Recent Primary Care Visit:  Provider: Everrett Coombe  Department: [facility]  Visit Type: PHYSICAL  Date: 05/20/2023  Medication: escitalopram (LEXAPRO) 20 MG tablet pantoprazole (PROTONIX) 40 MG tablet   Has the patient contacted their pharmacy? Yes (Agent: If no, request that the patient contact the pharmacy for the refill. If patient does not wish to contact the pharmacy document the reason why and proceed with request.) (Agent: If yes, when and what did the pharmacy advise?)  Is this the correct pharmacy for this prescription? Yes If no, delete pharmacy and type the correct one.  This is the patient's preferred pharmacy:  [facility] [address] [place] - [address] [place] [address] Phone: [phone] Fax: [phone]   Has the prescription been filled recently? No  Is the patient out of the medication? Yes  Has the patient been seen for an appointment in the last year OR does the patient have an upcoming appointment? Yes  Can we respond through MyChart? Yes  Agent: Please be advised that Rx refills may take up to 3 business days. We ask that you follow-up with your pharmacy.

## 2023-09-25 ENCOUNTER — Other Ambulatory Visit: Payer: Self-pay

## 2023-09-25 ENCOUNTER — Telehealth: Admitting: Neurology

## 2023-09-25 ENCOUNTER — Telehealth: Payer: Self-pay

## 2023-09-25 DIAGNOSIS — G4733 Obstructive sleep apnea (adult) (pediatric): Secondary | ICD-10-CM

## 2023-09-25 MED ORDER — PANTOPRAZOLE SODIUM 40 MG PO TBEC: 40.0000 mg | DELAYED_RELEASE_TABLET | Freq: Every day | ORAL | 0 refills | Status: DC

## 2023-09-25 MED ORDER — ESCITALOPRAM OXALATE 20 MG PO TABS: 20.0000 mg | ORAL_TABLET | ORAL | 0 refills | Status: DC | PRN

## 2023-09-25 NOTE — Patient Instructions (Signed)
 Great to meet you today.  Please contact her DME about ordering a new mask.  It is fine to continue with the nasal pillow.  Ensure you are changing out supplies regularly.  Continue nightly CPAP use minimum 4 hours.  I will contact you in 1 month to review the data.

## 2023-09-25 NOTE — Telephone Encounter (Signed)
 Order sent by community msg to [facility] D[facility]: [facility] Ph: [phone] Fax: [phone] Airsense 10 Setup 07/07/23 (appt needed 08/07/23 - 10/05/23)

## 2023-09-26 ENCOUNTER — Other Ambulatory Visit: Payer: Self-pay | Admitting: Family Medicine

## 2023-09-26 NOTE — Telephone Encounter (Signed)
 Pls contact the pt to schedule Mood follow-up appt with Dr. Ashley Royalty.

## 2023-09-26 NOTE — Telephone Encounter (Signed)
 Scheduled

## 2023-10-01 ENCOUNTER — Telehealth: Payer: Self-pay

## 2023-10-01 ENCOUNTER — Ambulatory Visit (INDEPENDENT_AMBULATORY_CARE_PROVIDER_SITE_OTHER): Admitting: Family Medicine

## 2023-10-01 ENCOUNTER — Encounter: Payer: Self-pay | Admitting: Family Medicine

## 2023-10-01 ENCOUNTER — Other Ambulatory Visit: Payer: Self-pay | Admitting: Family Medicine

## 2023-10-01 VITALS — BP 122/78 | HR 81 | Ht 71.0 in | Wt 345.8 lb

## 2023-10-01 DIAGNOSIS — R41840 Attention and concentration deficit: Secondary | ICD-10-CM | POA: Diagnosis not present

## 2023-10-01 DIAGNOSIS — F411 Generalized anxiety disorder: Secondary | ICD-10-CM

## 2023-10-01 MED ORDER — ESCITALOPRAM OXALATE 20 MG PO TABS
20.0000 mg | ORAL_TABLET | Freq: Every day | ORAL | 1 refills | Status: DC
Start: 2023-10-01 — End: 2024-04-01

## 2023-10-01 MED ORDER — ESCITALOPRAM OXALATE 20 MG PO TABS: 20.0000 mg | ORAL_TABLET | ORAL | 1 refills | Status: DC | PRN

## 2023-10-01 NOTE — Assessment & Plan Note (Signed)
 Referral placed for ADD testing.

## 2023-10-01 NOTE — Progress Notes (Signed)
 Jerry Davis - 38 y.o. male MRN [id number]  Date of birth: 1986/04/25  Subjective Chief Complaint  Patient presents with   Mood    HPI Jerry Davis is a 38 y.o. male here today for follow up visit.   Reports that he is doing pretty well with lexapro at current strength.  He has not really noted any significant side effects at current strength.  He doesn't feel like he needs to make any changes at this time.  He is concerned he may have ADD. He has had difficulty with attention and concentration throughout most of his life.  He would like to pursue testing.    ROS:  A comprehensive ROS was completed and negative except as noted per HPI  No Known Allergies  Past Medical History:  Diagnosis Date   Anxiety    Depression     No past surgical history on file.  Social History   Socioeconomic History   Marital status: Single    Spouse name: Not on file   Number of children: Not on file   Years of education: Not on file   Highest education level: GED or equivalent  Occupational History   Not on file  Tobacco Use   Smoking status: Former    Current packs/day: 0.00    Types: Cigarettes    Quit date: 11/30/2019    Years since quitting: 3.8   Smokeless tobacco: Never  Vaping Use   Vaping status: Every Day   Substances: Nicotine  Substance and Sexual Activity   Alcohol use: Not Currently   Drug use: Not Currently    Types: Marijuana   Sexual activity: Not Currently    Partners: Female  Other Topics Concern   Not on file  Social History Narrative   Caffiene coffee 1-2 12 0z cups.   Soda's liter   Working:  Arts administrator   Live family sister , brothers, cousins   Social Drivers of Corporate investment banker Strain: Low Risk  (09/30/2023)   Overall Financial Resource Strain (CARDIA)    Difficulty of Paying Living Expenses: Not very hard  Food Insecurity: No Food Insecurity (09/30/2023)   Hunger Vital Sign    Worried About Running Out of Food in the  Last Year: Never true    Ran Out of Food in the Last Year: Never true  Transportation Needs: No Transportation Needs (09/30/2023)   PRAPARE - Administrator, Civil Service (Medical): No    Lack of Transportation (Non-Medical): No  Physical Activity: Sufficiently Active (09/30/2023)   Exercise Vital Sign    Days of Exercise per Week: 5 days    Minutes of Exercise per Session: 120 min  Stress: Stress Concern Present (09/30/2023)   [employer] of Occupational Health - Occupational Stress Questionnaire    Feeling of Stress : To some extent  Social Connections: Socially Isolated (09/30/2023)   Social Connection and Isolation Panel [NHANES]    Frequency of Communication with Friends and Family: Once a week    Frequency of Social Gatherings with Friends and Family: Never    Attends Religious Services: Never    Database administrator or Organizations: No    Attends Engineer, structural: Not on file    Marital Status: Never married    Family History  Problem Relation Age of Onset   Sleep apnea Father    Cancer Maternal Grandfather    Heart disease Maternal Grandfather     Health  Maintenance  Topic Date Due   COVID-19 Vaccine (1 - 2024-25 season) Never done   Hepatitis C Screening  04/06/2024 (Originally 04/20/2006)   HIV Screening  04/06/2024 (Originally 04/21/2003)   INFLUENZA VACCINE  01/30/2024   DTaP/Tdap/Td (7 - Td or Tdap) 06/04/2028   HPV VACCINES  Aged Out     ----------------------------------------------------------------------------------------------------------------------------------------------------------------------------------------------------------------- Physical Exam BP 122/78 (Cuff Size: Large)   Pulse 81   Ht 5\' 11"  (1.803 m)   Wt (!) 345 lb 12.8 oz (156.9 kg)   SpO2 97%   BMI 48.23 kg/m   Physical Exam Constitutional:      Appearance: Normal appearance.  Eyes:     General: No scleral icterus. Cardiovascular:     Rate and  Rhythm: Normal rate and regular rhythm.  Pulmonary:     Effort: Pulmonary effort is normal.     Breath sounds: Normal breath sounds.  Neurological:     Mental Status: He is alert.  Psychiatric:        Mood and Affect: Mood normal.        Behavior: Behavior normal.     ------------------------------------------------------------------------------------------------------------------------------------------------------------------------------------------------------------------- Assessment and Plan  Decreased attention Span Referral placed for ADD testing.   GAD (generalized anxiety disorder) Doing well with lexapro.  Will plan to continue at current strength. F/u in 6 months.    Meds ordered this encounter  Medications   escitalopram (LEXAPRO) 20 MG tablet    Sig: Take 1 tablet (20 mg total) by mouth as needed.    Dispense:  90 tablet    Refill:  1    Return in about 6 months (around 04/01/2024) for Mood/BH.    This visit occurred during the SARS-CoV-2 public health emergency.  Safety protocols were in place, including screening questions prior to the visit, additional usage of staff PPE, and extensive cleaning of exam room while observing appropriate contact time as indicated for disinfecting solutions.

## 2023-10-01 NOTE — Telephone Encounter (Signed)
 Copied from CRM [id number]. Topic: Clinical - Prescription Issue >> Oct 01, 2023  3:37 PM Suzette B wrote: Reason for CRM: Pharmacy is calling and needed the complete directions on the escitalopram (LEXAPRO) 20 MG tablet  Pharmacy: [phone]

## 2023-10-01 NOTE — Telephone Encounter (Signed)
 Does this need to be taken daily ?

## 2023-10-01 NOTE — Assessment & Plan Note (Signed)
 Doing well with lexapro.  Will plan to continue at current strength. F/u in 6 months.

## 2023-10-05 DIAGNOSIS — R400 Somnolence: Secondary | ICD-10-CM | POA: Diagnosis not present

## 2023-10-28 ENCOUNTER — Encounter: Payer: Self-pay | Admitting: Neurology

## 2023-11-04 DIAGNOSIS — G4733 Obstructive sleep apnea (adult) (pediatric): Secondary | ICD-10-CM | POA: Diagnosis not present

## 2023-11-04 DIAGNOSIS — R400 Somnolence: Secondary | ICD-10-CM | POA: Diagnosis not present

## 2023-12-05 DIAGNOSIS — G4733 Obstructive sleep apnea (adult) (pediatric): Secondary | ICD-10-CM | POA: Diagnosis not present

## 2023-12-05 DIAGNOSIS — R400 Somnolence: Secondary | ICD-10-CM | POA: Diagnosis not present

## 2023-12-30 ENCOUNTER — Encounter: Payer: Self-pay | Admitting: Family Medicine

## 2023-12-30 ENCOUNTER — Ambulatory Visit (INDEPENDENT_AMBULATORY_CARE_PROVIDER_SITE_OTHER): Admitting: Family Medicine

## 2023-12-30 VITALS — BP 120/82 | HR 72 | Ht 71.0 in | Wt 348.0 lb

## 2023-12-30 DIAGNOSIS — F321 Major depressive disorder, single episode, moderate: Secondary | ICD-10-CM

## 2023-12-30 DIAGNOSIS — F329 Major depressive disorder, single episode, unspecified: Secondary | ICD-10-CM | POA: Insufficient documentation

## 2023-12-30 MED ORDER — CARIPRAZINE HCL 1.5 MG PO CAPS: 1.5000 mg | ORAL_CAPSULE | Freq: Every day | ORAL | 1 refills | Status: DC

## 2023-12-30 NOTE — Progress Notes (Signed)
 Jerry Davis - 38 y.o. male MRN [id number]  Date of birth: 29-Feb-1986  Subjective Chief Complaint  Patient presents with   Mood     HPI Jerry Davis is a 38 year old male here today for follow-up visit.  He does report that he has noted some increased depressive symptoms.  Anxiety remains pretty well-controlled with Lexapro  but feels more depressed and less motivated.  He has also had increased mood swings.  Mood fluctuates throughout the day.       12/30/2023    3:46 PM 10/01/2023    3:11 PM 04/07/2023    4:25 PM  Depression screen PHQ 2/9  Decreased Interest 2  2  Down, Depressed, Hopeless 2 0 2  PHQ - 2 Score 4 0 4  Altered sleeping 0 2 2  Tired, decreased energy 2 3 2   Change in appetite 2 2 3   Feeling bad or failure about yourself  1 3 3   Trouble concentrating 0 1 0  Moving slowly or fidgety/restless 0 1 0  Suicidal thoughts 0 0 0  PHQ-9 Score 9 12 14   Difficult doing work/chores  Somewhat difficult Somewhat difficult     Borderline positive MDQ  No Known Allergies  Past Medical History:  Diagnosis Date   Anxiety    Depression     History reviewed. No pertinent surgical history.  Social History   Socioeconomic History   Marital status: Single    Spouse name: Not on file   Number of children: Not on file   Years of education: Not on file   Highest education level: GED or equivalent  Occupational History   Not on file  Tobacco Use   Smoking status: Former    Current packs/day: 0.00    Types: Cigarettes    Quit date: 11/30/2019    Years since quitting: 4.0   Smokeless tobacco: Never  Vaping Use   Vaping status: Every Day   Substances: Nicotine  Substance and Sexual Activity   Alcohol use: Not Currently   Drug use: Not Currently    Types: Marijuana   Sexual activity: Not Currently    Partners: Female  Other Topics Concern   Not on file  Social History Narrative   Caffiene coffee 1-2 12 0z cups.   Soda's liter   Working:  Architectural technologist   Live family sister , brothers, cousins   Social Drivers of Corporate investment banker Strain: Medium Risk (12/29/2023)   Overall Financial Resource Strain (CARDIA)    Difficulty of Paying Living Expenses: Somewhat hard  Food Insecurity: Unknown (12/29/2023)   Hunger Vital Sign    Worried About Running Out of Food in the Last Year: Never true    Ran Out of Food in the Last Year: Patient declined  Transportation Needs: No Transportation Needs (12/29/2023)   PRAPARE - Administrator, Civil Service (Medical): No    Lack of Transportation (Non-Medical): No  Physical Activity: Sufficiently Active (12/29/2023)   Exercise Vital Sign    Days of Exercise per Week: 5 days    Minutes of Exercise per Session: 110 min  Stress: Stress Concern Present (12/29/2023)   [employer] of Occupational Health - Occupational Stress Questionnaire    Feeling of Stress: To some extent  Social Connections: Unknown (12/29/2023)   Social Connection and Isolation Panel    Frequency of Communication with Friends and Family: Patient declined    Frequency of Social Gatherings with Friends and Family: Patient declined  Attends Religious Services: Patient declined    Active Member of Clubs or Organizations: No    Attends Banker Meetings: Not on file    Marital Status: Never married  Recent Concern: Social Connections - Socially Isolated (09/30/2023)   Social Connection and Isolation Panel    Frequency of Communication with Friends and Family: Once a week    Frequency of Social Gatherings with Friends and Family: Never    Attends Religious Services: Never    Database administrator or Organizations: No    Attends Engineer, structural: Not on file    Marital Status: Never married    Family History  Problem Relation Age of Onset   Sleep apnea Father    Cancer Maternal Grandfather    Heart disease Maternal Grandfather     Health Maintenance  Topic Date Due    Hepatitis B Vaccines (3 of 3 - 3-dose series) 01/06/2001   HPV VACCINES (1 - 3-dose SCDM series) Never done   COVID-19 Vaccine (1 - 2024-25 season) Never done   Hepatitis C Screening  04/06/2024 (Originally 04/20/2006)   HIV Screening  04/06/2024 (Originally 04/21/2003)   INFLUENZA VACCINE  01/30/2024   DTaP/Tdap/Td (7 - Td or Tdap) 06/04/2028   Meningococcal B Vaccine  Aged Out     ----------------------------------------------------------------------------------------------------------------------------------------------------------------------------------------------------------------- Physical Exam BP 120/82 (BP Location: Left Arm, Patient Position: Sitting, Cuff Size: Large)   Pulse 72   Ht 5' 11 (1.803 m)   Wt (!) 348 lb (157.9 kg)   SpO2 98%   BMI 48.54 kg/m   Physical Exam Constitutional:      Appearance: Normal appearance.  Neurological:     General: No focal deficit present.     Mental Status: He is alert.  Psychiatric:        Mood and Affect: Mood normal.        Behavior: Behavior normal.     ------------------------------------------------------------------------------------------------------------------------------------------------------------------------------------------------------------------- Assessment and Plan  MDD (major depressive disorder) Increased depressive symptoms over the past several months.  Anxiety remains well-controlled with Lexapro .  MDQ is borderline positive.  Will add Vraylar to see if this is effective for his mood.  He will continue Lexapro  at current strength.  Follow-up in 6 weeks.   Meds ordered this encounter  Medications   cariprazine (VRAYLAR) 1.5 MG capsule    Sig: Take 1 capsule (1.5 mg total) by mouth daily.    Dispense:  30 capsule    Refill:  1    Return in about 6 weeks (around 02/10/2024) for Mood/BH.

## 2023-12-30 NOTE — Assessment & Plan Note (Signed)
 Increased depressive symptoms over the past several months.  Anxiety remains well-controlled with Lexapro .  MDQ is borderline positive.  Will add Vraylar to see if this is effective for his mood.  He will continue Lexapro  at current strength.  Follow-up in 6 weeks.

## 2024-01-01 ENCOUNTER — Telehealth: Payer: Self-pay

## 2024-01-01 ENCOUNTER — Other Ambulatory Visit (HOSPITAL_COMMUNITY): Payer: Self-pay

## 2024-01-01 NOTE — Telephone Encounter (Signed)
 Pharmacy Patient Advocate Encounter   Received notification from CoverMyMeds that prior authorization for Vraylar 1.5MG  capsules is required/requested.   Insurance verification completed.   The patient is insured through [employer] .   Per test claim: PA required; PA submitted to above mentioned insurance via CoverMyMeds Key/confirmation #/EOC [id number] Status is pending

## 2024-01-04 DIAGNOSIS — R400 Somnolence: Secondary | ICD-10-CM | POA: Diagnosis not present

## 2024-01-04 DIAGNOSIS — G4733 Obstructive sleep apnea (adult) (pediatric): Secondary | ICD-10-CM | POA: Diagnosis not present

## 2024-01-07 ENCOUNTER — Ambulatory Visit (INDEPENDENT_AMBULATORY_CARE_PROVIDER_SITE_OTHER): Admitting: Psychology

## 2024-01-07 DIAGNOSIS — F89 Unspecified disorder of psychological development: Secondary | ICD-10-CM
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
## 2024-01-08 NOTE — Telephone Encounter (Signed)
 Pharmacy Patient Advocate Encounter  Received notification from [facility] that Prior Authorization for Vraylar  1.5MG  capsules  has been DENIED.  Full denial letter will be uploaded to the media tab. See denial reason below.   PA #/Case ID/Reference #: [id number]

## 2024-01-11 ENCOUNTER — Encounter: Payer: Self-pay | Admitting: Family Medicine

## 2024-01-21 ENCOUNTER — Ambulatory Visit: Admitting: Psychology

## 2024-01-21 DIAGNOSIS — F89 Unspecified disorder of psychological development: Secondary | ICD-10-CM

## 2024-01-21 NOTE — Progress Notes (Signed)
 Date: 01/21/2024   Appointment Start Time: 5pm Duration: 45 minutes Provider: Frederic Fire, PsyD Type of Session: Testing Appointment for Evaluation  Location of Patient: Home Location of Provider: Provider's Home (private office) Type of Contact: Caregility video visit with audio  Session Content: Today's appointment was a telepsychological visit. Jerry Davis is aware it is his responsibility to secure confidentiality on his end of the session. Prior to proceeding with today's appointment, Jerry Davis's physical location at the time of this appointment was obtained as well a phone number he could be reached at in the event of technical difficulties. Jerry Davis denied anyone else being present in the room or on the virtual appointment. This provider reviewed that video should not be captured, photos should not be taken, nor should testing stimuli be copied or recorded as it would be a copyright violation. Jerry Davis expressed understanding of the aforementioned, and verbally consented to proceed. The WAIS-5 was administered, scored, and interpreted by this evaluator. Jerry Davis is aware of the limitations of teleheath visits and verbally consented to proceed.  Billing codes will be input on the feedback appointment. There are no billing codes for the testing appointment.   Provisional DSM-5 diagnosis(es):  F89 Unspecified Disorder of Psychological Development   Plan: Jerry Davis was scheduled for a feedback appointment on 02/04/2024 at 4pm via Caregility video visit with audio.                Jerry ONEIDA Fire, PsyD

## 2024-02-04 ENCOUNTER — Ambulatory Visit (INDEPENDENT_AMBULATORY_CARE_PROVIDER_SITE_OTHER): Admitting: Psychology

## 2024-02-04 DIAGNOSIS — F902 Attention-deficit hyperactivity disorder, combined type: Secondary | ICD-10-CM | POA: Diagnosis not present

## 2024-02-04 DIAGNOSIS — F4389 Other reactions to severe stress: Secondary | ICD-10-CM | POA: Diagnosis not present

## 2024-02-04 NOTE — Progress Notes (Signed)
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 Inconsistency scales are not elevated, suggesting she did not respond to the protocol in an overly negative, haphazard, extreme, or inconsistent manner. In the context of these validity considerations, Kristie's ratings of Lendell's everyday executive function suggest some areas of concern. The overall index score, the GEC, was highly elevated (GEC T = 88, %ile = >99). The Behavior Regulation Index (BRI), Emotion Regulation Index (ERI), and Cognitive Regulation Index (CRI) scores were all elevated (BRI T = 75, %ile = >99; ERI T = 81, %ile = >99, CRI T = 87, %ile = >99), suggesting self-regulatory problems in all measured domains. More specifically, Roxie indicated Lawrnce experiences difficulty with his ability to resist impulses, be aware of their functioning in social settings, adjust well to changes, react to events appropriately, get going on tasks and activities and independently generate ideas, sustain working memory, plan and organize their approach to problem solving appropriately, be appropriately cautious in their approach to tasks and check for mistakes, and keep materials and belongings reasonably well-organized. The elevated scores reflect that Traveion  is perceived as having significant problems with fundamental behavioral and/or emotional regulation (Inhibit, Emotional Control, and Shift), suggesting that more global problems with self-regulation are having an adverse effect on active cognitive problem solving (elevated CRI). Mr. Sawyer shared a belief that his sister "knows [him] the best" and is likely to have responded honestly with her perception of him.   Scale/Index  Raw Score T Score Percentile Qualitative Description  Inhibit 18 74 >99 Moderately Elevated  Self-Monitor 13 66 98 Mildly Elevated  Behavior Regulation Index (BRI) 31 75 >99 Highly Elevated  Shift 14 69 99 Mildly Elevated  Emotional Control 23 81 >99 Highly Elevated  Emotion Regulation Index (ERI) 37 81 >99 Highly Elevated  Initiate 20 79 >99 Highly Elevated  Working Memory 21 83 >99 Highly Elevated  Plan/Organize 22 85 >99 Highly Elevated  Task-Monitor 13 66 >99 Mildly Elevated  Organization of Materials 24 79 >99 Highly Elevated  Cognitive Regulation Index (CRI) 100 87 >99 Highly Elevated  Global Executive Composite (GEC) 168 88 >99 Highly Elevated   Validity Scale Raw Score Cumulative Percentile Protocol Classification  Inconsistency 1 99 Acceptable  Negativity 2 98 Acceptable  Infrequency 0 98 Acceptable   BEHAVIORAL FUNCTIONING   Patient Health Questionnaire-9 (PHQ-9): Mr. Tumbleson completed the PHQ-9, a self-report measure that assesses symptoms of depression. He scored 15/27, which indicates moderately severe depression.   Generalized Anxiety Disorder-7 (GAD-7): Mr. Rosenberg completed the GAD-7, a self-report measure that  assesses symptoms of anxiety. He scored 18/21, which indicates severe anxiety.   Adult ADHD Self-Report Scale Symptom Checklist (ASRS): Mr. Guggenheim reported the following symptoms as sometimes: difficulty getting things in order when a task requires organization, struggling to concentrate on what people say even when they are speaking  directly to him, and difficulty relaxing. He endorsed the following symptoms as occurring often: difficulty wrapping up the final details of a project following the completion of challenging aspects, avoiding or delaying getting started on tasks that require a lot of thought, being distracted by the noise around him, feeling restless or fidgety, and interrupting others or finishing their sentences. He endorsed the following symptoms as very often: problems remembering appointments or obligations, fidgeting or squirming, struggling to sustain attention when doing boring or repetitive work, misplacing or having difficulty finding things, and talking too much in social situations. The endorsement of at least four items in Part A is highly consistent with ADHD in adults. The frequency scores of Part B provide additional cues. Mr. Dikes scored 5/6 on Part A and 7/12 on Part B, which is considered a positive screening for ADHD.   PTSD Checklist for DSM-5 (PCL-5): The PCL-5 was administered. Mr. Mcclenton scored a 22/80, which is not indicative of him meeting the full criteria for PTSD. He endorsed repeated, disturbing, and unwanted memories of the stressful experience (moderately); repeated, disturbing dreams of the stressful experience (not at all); flashbacks (not at all); feeling very upset when something reminds you of the stressful experience (a little bit); having strong physical reactions when something reminds you of the stressful experience (not at all); avoiding memories, thoughts, or feelings related to the stressful experience (quite a bit); avoiding external reminders of the stressful experience (not at all); trouble remembering important parts of the stressful experience (not at all); having strong negative beliefs about yourself, other people, or the world (moderately); blaming yourself or someone else for the stressful experience or what happened after it (quite a bit); having strong negative feelings  such as fear, horror, anger, guilt, or shame (extremely); loss of interest in activities you used to enjoy (not at all); feeling distant or cut off from other people (a little bit); trouble experiencing positive feelings (a little bit); irritable behavior, angry outbursts, or acting aggressively (a little bit); taking too many risks or doing things that could cause you harm (not at all); being superalert or watchful or on guard (quite a bit); feeling jumpy or easily startled (not at all); having difficulty concentrating (a little bit); and trouble falling or staying asleep (not at all).   SUMMARY AND CLINICAL IMPRESSIONS: Mr. Maurion Walkowiak is a 38 year old male who Dr. Velma Ku referred for an evaluation to determine if he currently meets criteria for a diagnosis of Attention-Deficit/Hyperactivity Disorder (ADHD).   Mr. Warrell reported concerns that he has ADHD, as he has "started having memory problems really bad." He expressed a belief that his ADHD-related symptoms are a "part of who [he is]" and have been occurring since at least his "teenage years."   Mr. Janssen was administered assessments during the evaluation to measure his current cognitive abilities. His verbal comprehension abilities were in the average range, and he demonstrated the strongest performance on the Vocabulary subtest, which required him to explain the meaning of words presented in isolation. Additionally, performance on this subtest requires the ability to verbalize meaningful concepts, as well as retrieve information from long-term memory. His lowest performance was on the Similarities subtest, which measured  his ability to abstract meaningful concepts and relationships from verbally presented material. His ability to sustain attention, concentrate, and exert mental control was in the below-average range, which suggests a general weakness in attention, concentration, mental control, and shorter-term auditory memory. His  performance on the subtests was diverse. His lowest performance was on Digit Sequencing, and his best performance was on Digits Forward. This performance pattern suggests a relative strength with simple registration compared to both registration and maintenance of information. However, it is important to note that Mr. Lindahl expressed awareness that he had forgotten a digit in an answer to a prior subtest item, and with the addition of the forgotten digit, his answer would have been correct, and the discontinue rule would not have been met. Results of the CNS Vital Signs indicated an average neurocognitive processing ability. He demonstrated impairment in working memory and weakness in visual memory and sustained attention. However, his working memory and sustained attention scores were deemed potentially invalid. Upon follow-up, Mr. Kissick discussed how the importance of "limiting distractors" and maintaining his attention on the measure helped him to sustain his attention for the duration of the measure. He also reported he struggled with the visual memory portion and had difficulty understanding Part 4 of the CPT, which may at least partially explain his potentially invalid scores on that portion of the measure.  During the clinical interview and on self-report measures, Mr. Mcbain endorsed executive functioning concerns that include attentional dysregulation, hyperactivity- and impulsivity-related symptoms, and meeting the full criteria for ADHD. Moreover, his sister, Ms. Quamir Willemsen, indicated she perceives him as experiencing multiple significant executive functioning issues.  While invalid test results make interpretation difficult, when considering self-reported symptoms and his sister's perception of his executive functioning; endorsed and/or demonstrated impairment or weakness on measures of attention, executive functioning, and working memory; and a reported possible familial history of ADHD, a  diagnosis of F90.2 Attention-Deficit/Hyperactivity Disorder, Combined Presentation, Moderate appears warranted. The specifier of "Moderate" was assigned as he endorsed symptoms over what is needed to make the diagnosis and indicated they cause impairment in his academic (e.g., trouble sustaining his attention in class, a tendency to prioritize social aspects of college versus his academic functioning which contributed to him dropping out, regularly talking during class), social (e.g., frequently engaging in excessive talking and occasionally interrupting others), and daily (e.g., regularly experiencing being easily distracted, task initiation and completion issues, disorganization, and forgetfulness) functioning.   Mr. Aeschliman also endorsed a history of depressive episodes; panic attack symptomatology, and use of an unspecified psychotropic medication to assist with managing it; social anxiety-related symptoms; sleep onset and maintenance issues as well as sleep apnea that he utilizes CPAP; regularly only eating one meal a day; trauma and stressor-related disorder symptomatology that he attributed to a "couple incidences" involving his uncles as well as childhood bullying; and periods of suicidal ideation. As such, the PHQ-9, GAD-7, and PCL-5 were administered. His results suggested he experiences moderately severe depression- and severe anxiety-related symptoms. He also endorsed some symptoms of trauma- and stressor-related disorder (e.g., intrusive thoughts, negative alterations to cognition and mood, and avoidance behaviors), but not to an extent that meets full criteria for PTSD. 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 Given the limited scope of this evaluation, it was not possible to determine if full criteria for depressive disorder, panic disorder, social anxiety disorder, or sleep-wake disorder are met or if his symptoms are  better explained  by his diagnoses of ADHD and Other Specified Trauma and Stressor-Related Disorder, Subthreshold PTSD. Should any of the aforementioned be ruled in, they would likely be in addition to his diagnosis of ADHD, as he described his ADHD-related concerns as occurring before some of the concerns, as consistent across situations, and independent of mood. Moreover, while it is currently unclear if ADHD symptoms were present before his first endorsed trauma, he indicated that symptoms may have been present before trauma, that ADHD-related symptoms occur outside of hypervigilance states and intrusive thoughts, and he reported he experiences ADHD-related symptoms less commonly associated with trauma (e.g., excessive talking, habitual fidgeting, and interrupting of others). Upon follow-up, Mr. Kimberlin shared how the use of psychotropic medication for mood has been helpful.   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 Ledee would likely benefit from a consultation regarding medication for ADHD symptoms.   Individual therapeutic services may assist in processing a diagnosis of ADHD and discussing coping and compensatory strategies. Mr. Furney would likely benefit from making use of strategies for ADHD symptoms:  Setting a timer to complete tasks. Break tasks into manageable chunks and spread them out over more extended periods with breaks.  Utilizing lists and day calendars to keep track of tasks.  Answering emails daily.  Improve listening skills by asking the speaker to give information in smaller chunks and asking for explanations and clarification as needed. Leaving more than the anticipated time to complete tasks. It may help to keep tasks brief, well within your attention span, and a mix of both high and low-interest tasks.  Tasks may be gradually increased in length. Practice proactive planning by setting aside time every evening to plan for the next day (e.g., prepare needed materials or pack the car the night before).  Learn how to make a practical and reasonable "to-do" list of important tasks and priorities and always keep it easily accessible. Make additional copies in case it is lost or misplaced. Utilize visual reminders by posting appointments, "to-do lists," or schedules in strategic areas at home and work.  Practice using an appointment book, smartphone, or other tech device, or a daily planning calendar, and learn to write down appointments and commitments immediately. Keep notepads or use a portable audio recorder to capture important ideas that would be beneficial to recall later. Learn and practice time management skills. Purchase a programmable alarm watch or set an alarm on a smartphone to avoid losing track of time.  Use a color-coded file system, desk and closet organizers, storage boxes, or other organization devices to reduce clutter and improve efficiency and structure.  Implement ways to become more aware of your actions and to inhibit or adjust them as warranted (e.g., reviewing videos of your actions, considering consequences of obeying or not obeying the rules of various upcoming situations, having a trusted other to discuss plans with, and/or provide cues to stop certain behaviors, and make visual cues for rules you would like to follow). The 4Rs: Read just one paragraph, recite out loud in a soft voice or whisper what was important in that material, write that material down in a notebook, then review what you just wrote. Stay flexible and be prepared to change your plans, as symptom breakthroughs and crises will likely occur periodically. Mr. Trapp may benefit from mindfulness training to address symptoms of inattention.  Mental alertness/energy can be raised by increasing exercise; improving  sleep; eating a  healthy diet; and managing trauma and stress. Consulting with a physician regarding any changes to the physical regimen is recommended. "Failing at Normal: An ADHD Success Story" by Harlene Solar is a great overview of ADHD. Dr. Nelwyn Pica also has a YouTube channel called, "Nelwyn Pica, PhD - Dedicated to ADHD Science+" with helpful videos on ADHD-related topics: [email or web address]  Applications:  RescueTime. Tracks your activities on your phone and/or computer to determine how productive you have been and what distracted you. Free two-week trial.  Focus@Will . It uses engineered audio that may reduce distractions and assist with focus. Free 15-day trial. Freedom. Allows you to highlight days and times you want to block yourself from certain sites or apps. Free trial. Merrily.  It allows you to input your bank accounts and creates a visual layout of information about your financial goals, budget management, alerts, etc. May offer a free trial. Boomerang. It allows you to schedule times an email is sent and to see if others have received or opened your email. Ten messages free per month and a free trial of the premium version. IFTTT. Uses "channels" to create various actions (e.g., if you are mentioned in an email, highlight it in your inbox, and if you miss a call, add it to a to-do list). Free and premium versions. Unroll.me. Cleans up your email by unsubscribing from what you do not want to receive while still getting everything you do. Free. Finish. It allows you to divide two-list tasks into short-term, mid-term, and long-term tasks and determine how much time is left for a task. Focus mode hides non-priority tasks.  Autosilent. Turns your phone ringer on and off based on specified calendars, geo-fences, timers, etc. $3.99. Freakyalarm. It makes you solve math problems to disable an alarm. $1.99. Wake N Shake. It makes you vigorously shake your  phone to stop the alarm. $.99. Todoist. It allows you to add sub-tasks to tasks and includes email and web plugins to make it work across the system. Premium has location-based reminders, calendar sync, productive tracking, etc.  Sleep Cycle. Utilize your phone's motion sensors to catch movement while you are asleep. The alarm will wake you as early as 30 minutes before your alarm based on your lightest sleep phase and show you how daily activities affect your sleep quality.  Books: "Taking Charge of Adult ADHD Second Edition" by Dr. Nelwyn Pica "The ADHD Effect on Marriage" by Eleanor Bowers "The Couples Guide to Thriving with ADHD" by Eleanor Bowers Organizations that are a reliable source of information on ADHD:  Children and Adults with Attention-Deficit/Hyperactivity Disorder (CHADD): chadd.org  Attention Deficit Disorder Association (ADDA): HotterNames.de ADD Resources: addresources.org ADD WareHouse: addwarehouse.com World Federation of ADHD: adhd-federation.org ADDConsults: FightListings.se. Compilation of ADHD resources: https://www.harrell.com/ Future evaluation, if deemed necessary, and/or to determine the effectiveness of recommended interventions.   Frederic Fire, Psy.D. Licensed Psychologist - [facility] [phone]   References  Pica, R. A. (2021). Taking charge of adult ADHD: proven strategies to succeed at work, at   home, and in relationships (pp. 6-10 and 272-276). [employer].             Frederic ONEIDA Fire, PsyD

## 2024-02-10 ENCOUNTER — Ambulatory Visit (INDEPENDENT_AMBULATORY_CARE_PROVIDER_SITE_OTHER): Admitting: Family Medicine

## 2024-02-10 ENCOUNTER — Encounter: Payer: Self-pay | Admitting: Family Medicine

## 2024-02-10 VITALS — BP 129/74 | HR 68 | Resp 20 | Ht 71.0 in | Wt 364.0 lb

## 2024-02-10 DIAGNOSIS — F321 Major depressive disorder, single episode, moderate: Secondary | ICD-10-CM | POA: Diagnosis not present

## 2024-02-10 DIAGNOSIS — R41840 Attention and concentration deficit: Secondary | ICD-10-CM | POA: Diagnosis not present

## 2024-02-10 NOTE — Assessment & Plan Note (Signed)
 Has upcoming visit with psychiatry to discuss ADHD.

## 2024-02-10 NOTE — Progress Notes (Signed)
 Jerry Davis - 38 y.o. male MRN [id number]  Date of birth: [birth year]  Subjective Chief Complaint  Patient presents with   Follow-up    MOOD, VRAYLAR     HPI Jerry Davis is a 38 y.o. male here today for follow up visit.   Vraylar  added at last visit.  Reports that he has not done well at all with this.  He feels more restless and a little more agitated since starting.  Also feels like he can't get full when eating and has put on weight.  He feels that he continues to do pretty well with lexapro  at current strength.  He does also have a visit with psychiatry to discuss treatment of ADHD which he thinks may be contributing to some of his symptoms.   ROS:  A comprehensive ROS was completed and negative except as noted per HPI  No Known Allergies  Past Medical History:  Diagnosis Date   Anxiety    Depression     History reviewed. No pertinent surgical history.  Social History   Socioeconomic History   Marital status: Single    Spouse name: Not on file   Number of children: Not on file   Years of education: Not on file   Highest education level: GED or equivalent  Occupational History   Not on file  Tobacco Use   Smoking status: Former    Current packs/day: 0.00    Types: Cigarettes    Quit date: 11/30/2019    Years since quitting: 4.2   Smokeless tobacco: Never  Vaping Use   Vaping status: Every Day   Substances: Nicotine  Substance and Sexual Activity   Alcohol use: Not Currently   Drug use: Not Currently    Types: Marijuana   Sexual activity: Not Currently    Partners: Female  Other Topics Concern   Not on file  Social History Narrative   Caffiene coffee 1-2 12 0z cups.   Soda's liter   Working:  Arts administrator   Live family sister , brothers, cousins   Social Drivers of Corporate investment banker Strain: Medium Risk (12/29/2023)   Overall Financial Resource Strain (CARDIA)    Difficulty of Paying Living Expenses: Somewhat hard  Food  Insecurity: Unknown (12/29/2023)   Hunger Vital Sign    Worried About Running Out of Food in the Last Year: Never true    Ran Out of Food in the Last Year: Patient declined  Transportation Needs: No Transportation Needs (12/29/2023)   PRAPARE - Administrator, Civil Service (Medical): No    Lack of Transportation (Non-Medical): No  Physical Activity: Sufficiently Active (12/29/2023)   Exercise Vital Sign    Days of Exercise per Week: 5 days    Minutes of Exercise per Session: 110 min  Stress: Stress Concern Present (12/29/2023)   [employer] of Occupational Health - Occupational Stress Questionnaire    Feeling of Stress: To some extent  Social Connections: Unknown (12/29/2023)   Social Connection and Isolation Panel    Frequency of Communication with Friends and Family: Patient declined    Frequency of Social Gatherings with Friends and Family: Patient declined    Attends Religious Services: Patient declined    Database administrator or Organizations: No    Attends Engineer, structural: Not on file    Marital Status: Never married  Recent Concern: Social Connections - Socially Isolated (09/30/2023)   Social Connection and Isolation Panel  Frequency of Communication with Friends and Family: Once a week    Frequency of Social Gatherings with Friends and Family: Never    Attends Religious Services: Never    Database administrator or Organizations: No    Attends Engineer, structural: Not on file    Marital Status: Never married    Family History  Problem Relation Age of Onset   Sleep apnea Father    Cancer Maternal Grandfather    Heart disease Maternal Grandfather     Health Maintenance  Topic Date Due   Hepatitis B Vaccines (3 of 3 - 3-dose series) 01/06/2001   HPV VACCINES (1 - 3-dose SCDM series) Never done   COVID-19 Vaccine (1 - 2024-25 season) Never done   INFLUENZA VACCINE  01/30/2024   Hepatitis C Screening  04/06/2024 (Originally  04/20/2006)   HIV Screening  04/06/2024 (Originally 04/21/2003)   DTaP/Tdap/Td (7 - Td or Tdap) 06/04/2028   Meningococcal B Vaccine  Aged Out     ----------------------------------------------------------------------------------------------------------------------------------------------------------------------------------------------------------------- Physical Exam BP 129/74 (BP Location: Left Arm, Patient Position: Sitting, Cuff Size: Large)   Pulse 68   Resp 20   Ht 5' 11 (1.803 m)   Wt (!) 364 lb (165.1 kg)   SpO2 96%   BMI 50.77 kg/m   Physical Exam Constitutional:      Appearance: Normal appearance.  HENT:     Head: Normocephalic and atraumatic.  Cardiovascular:     Rate and Rhythm: Normal rate and regular rhythm.  Pulmonary:     Effort: Pulmonary effort is normal.     Breath sounds: Normal breath sounds.  Musculoskeletal:     Cervical back: Neck supple.  Neurological:     General: No focal deficit present.     Mental Status: He is alert.  Psychiatric:        Mood and Affect: Mood normal.        Behavior: Behavior normal.     ------------------------------------------------------------------------------------------------------------------------------------------------------------------------------------------------------------------- Assessment and Plan  MDD (major depressive disorder) Did not tolerate Vraylar  well at all.  Will discontinue.  Continue lexapro  at current strength.    Decreased attention Span Has upcoming visit with psychiatry to discuss ADHD.     No orders of the defined types were placed in this encounter.   No follow-ups on file.

## 2024-02-10 NOTE — Assessment & Plan Note (Signed)
 Did not tolerate Vraylar  well at all.  Will discontinue.  Continue lexapro  at current strength.

## 2024-02-24 ENCOUNTER — Other Ambulatory Visit: Payer: Self-pay | Admitting: Family Medicine

## 2024-02-24 DIAGNOSIS — F902 Attention-deficit hyperactivity disorder, combined type: Secondary | ICD-10-CM | POA: Diagnosis not present

## 2024-03-15 ENCOUNTER — Ambulatory Visit
Admission: EM | Admit: 2024-03-15 | Discharge: 2024-03-15 | Disposition: A | Discharge status: Home / Self Care | Attending: Family Medicine | Admitting: Family Medicine

## 2024-03-15 DIAGNOSIS — H6691 Otitis media, unspecified, right ear: Secondary | ICD-10-CM | POA: Diagnosis not present

## 2024-03-15 MED ORDER — AMOXICILLIN-POT CLAVULANATE 875-125 MG PO TABS
1.0000 | ORAL_TABLET | Freq: Two times a day (BID) | ORAL | 0 refills | Status: AC
Start: 2024-03-15 — End: 2024-03-25

## 2024-03-15 NOTE — Discharge Instructions (Addendum)
 Patient take medication as directed with food to completion.  Encouraged to increase daily water intake to 64 ounces per day while taking this medication.  Advised if symptoms worsen and/or unresolved please follow-up with your PCP, ENT, or here for further evaluation.

## 2024-03-15 NOTE — ED Provider Notes (Signed)
 [facility]    CSN: [id number] Arrival date & time: 03/15/24  1115      History   Chief Complaint Chief Complaint  Patient presents with   Otalgia    RT   Cough   Nasal Congestion    HPI Jerry Davis is a 38 y.o. male.   HPI 38 year old male presents with right ear pain radiating into right jaw with cough and congestion since Friday, 03/12/2024.  PMH significant for morbid obesity, MDD, and OSA.  Past Medical History:  Diagnosis Date   Anxiety    Depression     Patient Active Problem List   Diagnosis Date Noted   MDD (major depressive disorder) 12/30/2023   Decreased attention Span 10/01/2023   OSA on CPAP 09/25/2023   Well adult exam 05/20/2023   Daytime somnolence 05/20/2023   GAD (generalized anxiety disorder) 06/25/2020   Increased thirst 06/25/2020   Other fatigue 06/25/2020   Blood pressure elevated without history of HTN 06/25/2020   GERD (gastroesophageal reflux disease) 06/22/2020    History reviewed. No pertinent surgical history.     Home Medications    Prior to Admission medications   Medication Sig Start Date End Date Taking? Authorizing Provider  amoxicillin -clavulanate (AUGMENTIN ) 875-125 MG tablet Take 1 tablet by mouth 2 (two) times daily for 10 days. 03/15/24 03/25/24 Yes Teddy Sharper, FNP  escitalopram  (LEXAPRO ) 20 MG tablet Take 1 tablet (20 mg total) by mouth daily. 10/01/23   Alvia Bring, DO  pantoprazole  (PROTONIX ) 40 MG tablet Take 1 tablet by mouth once daily 09/26/23   Alvia Bring, DO    Family History Family History  Problem Relation Age of Onset   Sleep apnea Father    Cancer Maternal Grandfather    Heart disease Maternal Grandfather     Social History Social History   Tobacco Use   Smoking status: Former    Current packs/day: 0.00    Types: Cigarettes    Quit date: 11/30/2019    Years since quitting: 4.2   Smokeless tobacco: Never  Vaping Use   Vaping status: Every Day   Substances: Nicotine   Substance Use Topics   Alcohol use: Not Currently   Drug use: Not Currently    Types: Marijuana     Allergies   Patient has no known allergies.   Review of Systems Review of Systems  HENT:  Positive for congestion and ear pain.   Respiratory:  Positive for cough.   All other systems reviewed and are negative.    Physical Exam Triage Vital Signs ED Triage Vitals  Encounter Vitals Group     BP      Girls Systolic BP Percentile      Girls Diastolic BP Percentile      Boys Systolic BP Percentile      Boys Diastolic BP Percentile      Pulse      Resp      Temp      Temp src      SpO2      Weight      Height      Head Circumference      Peak Flow      Pain Score      Pain Loc      Pain Education      Exclude from Growth Chart    No data found.  Updated Vital Signs BP (!) 152/100 (BP Location: Right Arm)   Pulse 88   Temp 98  F (36.7 C) (Oral)   Resp 17   SpO2 96%   Visual Acuity Right Eye Distance:   Left Eye Distance:   Bilateral Distance:    Right Eye Near:   Left Eye Near:    Bilateral Near:     Physical Exam Vitals and nursing note reviewed.  Constitutional:      Appearance: Normal appearance. He is obese. He is ill-appearing.  HENT:     Head: Normocephalic and atraumatic.     Right Ear: External ear normal.     Left Ear: Tympanic membrane and external ear normal.     Ears:     Comments: Right TM: Erythematous, bulging    Mouth/Throat:     Mouth: Mucous membranes are moist.     Pharynx: Oropharynx is clear.  Eyes:     Extraocular Movements: Extraocular movements intact.     Pupils: Pupils are equal, round, and reactive to light.  Cardiovascular:     Rate and Rhythm: Normal rate and regular rhythm.     Pulses: Normal pulses.     Heart sounds: Normal heart sounds.  Pulmonary:     Effort: Pulmonary effort is normal.     Breath sounds: Normal breath sounds. No wheezing, rhonchi or rales.  Musculoskeletal:        General: Normal range of  motion.  Skin:    General: Skin is warm and dry.  Neurological:     General: No focal deficit present.     Mental Status: He is alert and oriented to person, place, and time. Mental status is at baseline.  Psychiatric:        Mood and Affect: Mood normal.        Behavior: Behavior normal.      UC Treatments / Results  Labs (all labs ordered are listed, but only abnormal results are displayed) Labs Reviewed - No data to display  EKG   Radiology No results found.  Procedures Procedures (including critical care time)  Medications Ordered in UC Medications - No data to display  Initial Impression / Assessment and Plan / UC Course  I have reviewed the triage vital signs and the nursing notes.  Pertinent labs & imaging results that were available during my care of the patient were reviewed by me and considered in my medical decision making (see chart for details).     MDM: 1.  Acute right otitis media-Rx'd Augmentin  875/125 mg tablet: Take 1 tablet twice daily x 10 days. Patient take medication as directed with food to completion.  Encouraged to increase daily water intake to 64 ounces per day while taking this medication.  Advised if symptoms worsen and/or unresolved please follow-up with your PCP, ENT, or here for further evaluation.  Patient discharged home, hemodynamically stable. Final Clinical Impressions(s) / UC Diagnoses   Final diagnoses:  Acute right otitis media     Discharge Instructions      Patient take medication as directed with food to completion.  Encouraged to increase daily water intake to 64 ounces per day while taking this medication.  Advised if symptoms worsen and/or unresolved please follow-up with your PCP, ENT, or here for further evaluation.     ED Prescriptions     Medication Sig Dispense Auth. Provider   amoxicillin -clavulanate (AUGMENTIN ) 875-125 MG tablet Take 1 tablet by mouth 2 (two) times daily for 10 days. 20 tablet Teyah Rossy,  FNP      PDMP not reviewed this encounter.   Massie Cogliano,  FNP 03/15/24 1154

## 2024-03-15 NOTE — ED Triage Notes (Signed)
 Pt c/o RT ear pain (radiating into jaw), cough and congestion since Fri. Denies fever.

## 2024-03-29 DIAGNOSIS — F902 Attention-deficit hyperactivity disorder, combined type: Secondary | ICD-10-CM | POA: Diagnosis not present

## 2024-04-01 ENCOUNTER — Ambulatory Visit (INDEPENDENT_AMBULATORY_CARE_PROVIDER_SITE_OTHER): Admitting: Family Medicine

## 2024-04-01 ENCOUNTER — Encounter: Payer: Self-pay | Admitting: Family Medicine

## 2024-04-01 VITALS — BP 122/82 | HR 90 | Ht 71.0 in | Wt 364.0 lb

## 2024-04-01 DIAGNOSIS — F909 Attention-deficit hyperactivity disorder, unspecified type: Secondary | ICD-10-CM | POA: Insufficient documentation

## 2024-04-01 DIAGNOSIS — F411 Generalized anxiety disorder: Secondary | ICD-10-CM

## 2024-04-01 MED ORDER — PANTOPRAZOLE SODIUM 40 MG PO TBEC: 40.0000 mg | DELAYED_RELEASE_TABLET | Freq: Every day | ORAL | 1 refills | Status: AC

## 2024-04-01 MED ORDER — ESCITALOPRAM OXALATE 20 MG PO TABS: 20.0000 mg | ORAL_TABLET | Freq: Every day | ORAL | 1 refills | Status: AC

## 2024-04-01 NOTE — Assessment & Plan Note (Signed)
 Doing well with lexapro.  Will plan to continue at current strength. F/u in 6 months.

## 2024-04-01 NOTE — Patient Instructions (Signed)
 Try flonase or astepro daily.

## 2024-04-01 NOTE — Assessment & Plan Note (Signed)
 Currently on adderall.  This is being managed by [facility].

## 2024-04-01 NOTE — Progress Notes (Signed)
 Jerry Davis - 38 y.o. male MRN [id number]  Date of birth: 1986-01-02  Subjective Chief Complaint  Patient presents with   Mood   Otalgia    HPI Jerry Davis is a 38 y.o. male here today for follow up.   He reports that he is doing well.  Recent OM that has resolved.  Mild pressure at times but no pain.    He continues on lexapro  20mg  dialy.  He reports that he is doing pretty well with this.   He did recently start adderall for management of ADHD symptoms as well.  He has only been on this for a couple of days.  This is being managed through [facility]  Protonix  continues to remain effective for GERD.    ROS:  A comprehensive ROS was completed and negative except as noted per HPI  No Known Allergies  Past Medical History:  Diagnosis Date   Anxiety    Depression     History reviewed. No pertinent surgical history.  Social History   Socioeconomic History   Marital status: Single    Spouse name: Not on file   Number of children: Not on file   Years of education: Not on file   Highest education level: GED or equivalent  Occupational History   Not on file  Tobacco Use   Smoking status: Former    Current packs/day: 0.00    Types: Cigarettes    Quit date: 11/30/2019    Years since quitting: 4.3   Smokeless tobacco: Never  Vaping Use   Vaping status: Every Day   Substances: Nicotine  Substance and Sexual Activity   Alcohol use: Not Currently   Drug use: Not Currently    Types: Marijuana   Sexual activity: Not Currently    Partners: Female  Other Topics Concern   Not on file  Social History Narrative   Caffiene coffee 1-2 12 0z cups.   Soda's liter   Working:  Arts administrator   Live family sister , brothers, cousins   Social Drivers of Corporate investment banker Strain: Medium Risk (12/29/2023)   Overall Financial Resource Strain (CARDIA)    Difficulty of Paying Living Expenses: Somewhat hard  Food Insecurity: Unknown (12/29/2023)   Hunger  Vital Sign    Worried About Running Out of Food in the Last Year: Never true    Ran Out of Food in the Last Year: Patient declined  Transportation Needs: No Transportation Needs (12/29/2023)   PRAPARE - Administrator, Civil Service (Medical): No    Lack of Transportation (Non-Medical): No  Physical Activity: Sufficiently Active (12/29/2023)   Exercise Vital Sign    Days of Exercise per Week: 5 days    Minutes of Exercise per Session: 110 min  Stress: Stress Concern Present (12/29/2023)   [employer] of Occupational Health - Occupational Stress Questionnaire    Feeling of Stress: To some extent  Social Connections: Unknown (12/29/2023)   Social Connection and Isolation Panel    Frequency of Communication with Friends and Family: Patient declined    Frequency of Social Gatherings with Friends and Family: Patient declined    Attends Religious Services: Patient declined    Database administrator or Organizations: No    Attends Engineer, structural: Not on file    Marital Status: Never married  Recent Concern: Social Connections - Socially Isolated (09/30/2023)   Social Connection and Isolation Panel    Frequency of Communication with  Friends and Family: Once a week    Frequency of Social Gatherings with Friends and Family: Never    Attends Religious Services: Never    Database administrator or Organizations: No    Attends Engineer, structural: Not on file    Marital Status: Never married    Family History  Problem Relation Age of Onset   Sleep apnea Father    Cancer Maternal Grandfather    Heart disease Maternal Grandfather     Health Maintenance  Topic Date Due   Hepatitis B Vaccines 19-59 Average Risk (3 of 3 - 3-dose series) 01/06/2001   HPV VACCINES (1 - 3-dose SCDM series) Never done   Hepatitis C Screening  04/06/2024 (Originally 04/20/2006)   HIV Screening  04/06/2024 (Originally 04/21/2003)   Influenza Vaccine  09/28/2024 (Originally  01/30/2024)   COVID-19 Vaccine (1 - 2024-25 season) 04/17/2025 (Originally 03/01/2024)   DTaP/Tdap/Td (7 - Td or Tdap) 06/04/2028   Pneumococcal Vaccine  Aged Out   Meningococcal B Vaccine  Aged Out     ----------------------------------------------------------------------------------------------------------------------------------------------------------------------------------------------------------------- Physical Exam BP 122/82   Pulse 90   Ht 5' 11 (1.803 m)   Wt (!) 364 lb (165.1 kg)   SpO2 95%   BMI 50.77 kg/m   Physical Exam Constitutional:      Appearance: Normal appearance.  Cardiovascular:     Rate and Rhythm: Normal rate and regular rhythm.  Pulmonary:     Effort: Pulmonary effort is normal.     Breath sounds: Normal breath sounds.  Neurological:     General: No focal deficit present.     Mental Status: He is alert.  Psychiatric:        Mood and Affect: Mood normal.        Behavior: Behavior normal.     ------------------------------------------------------------------------------------------------------------------------------------------------------------------------------------------------------------------- Assessment and Plan  GAD (generalized anxiety disorder) Doing well with lexapro .  Will plan to continue at current strength. F/u in 6 months.   ADHD Currently on adderall.  This is being managed by [facility].     No orders of the defined types were placed in this encounter.   Return in about 6 months (around 09/30/2024) for Mood/BH.

## 2024-04-15 ENCOUNTER — Encounter (INDEPENDENT_AMBULATORY_CARE_PROVIDER_SITE_OTHER): Payer: Self-pay | Admitting: Family Medicine

## 2024-04-15 DIAGNOSIS — U071 COVID-19: Secondary | ICD-10-CM

## 2024-04-15 DIAGNOSIS — F902 Attention-deficit hyperactivity disorder, combined type: Secondary | ICD-10-CM | POA: Diagnosis not present

## 2024-04-16 MED ORDER — NIRMATRELVIR/RITONAVIR (PAXLOVID)TABLET: 3.0000 | ORAL_TABLET | Freq: Two times a day (BID) | ORAL | 0 refills | Status: AC

## 2024-04-16 NOTE — Telephone Encounter (Signed)
Please see the MyChart message reply(ies) for my assessment and plan.    This patient gave consent for this Medical Advice Message and is aware that it may result in a bill to their insurance company, as well as the possibility of receiving a bill for a co-payment or deductible. They are an established patient, but are not seeking medical advice exclusively about a problem treated during an in person or video visit in the last seven days. I did not recommend an in person or video visit within seven days of my reply.    I spent a total of 8 minutes cumulative time within 7 days through MyChart messaging.  Cody Matthews, DO   

## 2024-04-21 ENCOUNTER — Telehealth: Admitting: Neurology

## 2024-09-30 ENCOUNTER — Ambulatory Visit: Admitting: Family Medicine
